# Patient Record
Sex: Female | Born: 1975
Health system: Southern US, Community
[De-identification: ages and names within clinical notes are randomized; demographics above are authoritative.]

## PROBLEM LIST (undated history)

## (undated) ENCOUNTER — Ambulatory Visit: Disposition: A | Discharge status: Home / Self Care | Payer: Self-pay

## (undated) DIAGNOSIS — Z9189 Other specified personal risk factors, not elsewhere classified: Secondary | ICD-10-CM

## (undated) DIAGNOSIS — Z1371 Encounter for nonprocreative screening for genetic disease carrier status: Secondary | ICD-10-CM

## (undated) DIAGNOSIS — D649 Anemia, unspecified: Secondary | ICD-10-CM

## (undated) DIAGNOSIS — F419 Anxiety disorder, unspecified: Secondary | ICD-10-CM

## (undated) DIAGNOSIS — Z803 Family history of malignant neoplasm of breast: Secondary | ICD-10-CM

## (undated) DIAGNOSIS — Z8041 Family history of malignant neoplasm of ovary: Secondary | ICD-10-CM

## (undated) DIAGNOSIS — Z9289 Personal history of other medical treatment: Secondary | ICD-10-CM

## (undated) DIAGNOSIS — Z1379 Encounter for other screening for genetic and chromosomal anomalies: Secondary | ICD-10-CM

## (undated) DIAGNOSIS — N83209 Unspecified ovarian cyst, unspecified side: Secondary | ICD-10-CM

## (undated) DIAGNOSIS — D573 Sickle-cell trait: Secondary | ICD-10-CM

## (undated) DIAGNOSIS — O30009 Twin pregnancy, unspecified number of placenta and unspecified number of amniotic sacs, unspecified trimester: Secondary | ICD-10-CM

## (undated) DIAGNOSIS — Z6790 Unspecified blood type, Rh positive: Secondary | ICD-10-CM

## (undated) HISTORY — DX: Twin pregnancy, unspecified number of placenta and unspecified number of amniotic sacs, unspecified trimester: O30.009

## (undated) HISTORY — DX: Anxiety disorder, unspecified: F41.9

## (undated) HISTORY — DX: Other specified personal risk factors, not elsewhere classified: Z91.89

## (undated) HISTORY — DX: Unspecified blood type, Rh positive: Z67.90

## (undated) HISTORY — DX: Unspecified ovarian cyst, unspecified side: N83.209

## (undated) HISTORY — DX: Family history of malignant neoplasm of ovary: Z80.41

## (undated) HISTORY — DX: Family history of malignant neoplasm of breast: Z80.3

## (undated) HISTORY — DX: Personal history of other medical treatment: Z92.89

## (undated) HISTORY — DX: Anemia, unspecified: D64.9

## (undated) HISTORY — DX: Encounter for nonprocreative screening for genetic disease carrier status: Z13.71

## (undated) HISTORY — DX: Encounter for other screening for genetic and chromosomal anomalies: Z13.79

## (undated) HISTORY — DX: Sickle-cell trait: D57.3

---

## 2004-05-29 HISTORY — PX: FOOT SURGERY: SHX648

## 2004-11-25 ENCOUNTER — Ambulatory Visit: Payer: Self-pay | Admitting: Podiatry

## 2005-06-16 ENCOUNTER — Ambulatory Visit: Payer: Self-pay | Admitting: Occupational Therapy

## 2007-01-10 ENCOUNTER — Encounter: Payer: Self-pay | Admitting: Maternal & Fetal Medicine

## 2007-03-21 ENCOUNTER — Encounter: Payer: Self-pay | Admitting: Maternal and Fetal Medicine

## 2007-05-15 ENCOUNTER — Inpatient Hospital Stay: Payer: Self-pay

## 2007-05-15 DIAGNOSIS — O30009 Twin pregnancy, unspecified number of placenta and unspecified number of amniotic sacs, unspecified trimester: Secondary | ICD-10-CM

## 2007-05-15 HISTORY — DX: Twin pregnancy, unspecified number of placenta and unspecified number of amniotic sacs, unspecified trimester: O30.009

## 2011-06-21 ENCOUNTER — Ambulatory Visit: Payer: Self-pay

## 2013-11-26 DIAGNOSIS — N83209 Unspecified ovarian cyst, unspecified side: Secondary | ICD-10-CM

## 2013-11-26 HISTORY — DX: Unspecified ovarian cyst, unspecified side: N83.209

## 2015-07-22 ENCOUNTER — Encounter: Payer: Self-pay | Admitting: Family Medicine

## 2015-08-17 ENCOUNTER — Ambulatory Visit (INDEPENDENT_AMBULATORY_CARE_PROVIDER_SITE_OTHER): Payer: BLUE CROSS/BLUE SHIELD | Admitting: Family Medicine

## 2015-08-17 ENCOUNTER — Encounter: Payer: Self-pay | Admitting: Family Medicine

## 2015-08-17 VITALS — BP 120/62 | HR 70 | Temp 98.7°F | Resp 14 | Ht 66.0 in | Wt 167.0 lb

## 2015-08-17 DIAGNOSIS — R599 Enlarged lymph nodes, unspecified: Secondary | ICD-10-CM | POA: Diagnosis not present

## 2015-08-17 DIAGNOSIS — E559 Vitamin D deficiency, unspecified: Secondary | ICD-10-CM | POA: Diagnosis not present

## 2015-08-17 DIAGNOSIS — Z0000 Encounter for general adult medical examination without abnormal findings: Secondary | ICD-10-CM

## 2015-08-17 DIAGNOSIS — R591 Generalized enlarged lymph nodes: Secondary | ICD-10-CM

## 2015-08-17 LAB — COMPREHENSIVE METABOLIC PANEL
ALK PHOS: 49 U/L (ref 33–115)
ALT: 8 U/L (ref 6–29)
AST: 13 U/L (ref 10–30)
Albumin: 3.8 g/dL (ref 3.6–5.1)
BUN: 7 mg/dL (ref 7–25)
CHLORIDE: 108 mmol/L (ref 98–110)
CO2: 23 mmol/L (ref 20–31)
Calcium: 8.9 mg/dL (ref 8.6–10.2)
Creat: 0.7 mg/dL (ref 0.50–1.10)
GLUCOSE: 80 mg/dL (ref 70–99)
POTASSIUM: 4 mmol/L (ref 3.5–5.3)
Sodium: 136 mmol/L (ref 135–146)
Total Bilirubin: 0.8 mg/dL (ref 0.2–1.2)
Total Protein: 7.1 g/dL (ref 6.1–8.1)

## 2015-08-17 LAB — CBC WITH DIFFERENTIAL/PLATELET
BASOS ABS: 0 10*3/uL (ref 0.0–0.1)
Basophils Relative: 1 % (ref 0–1)
EOS ABS: 0.1 10*3/uL (ref 0.0–0.7)
EOS PCT: 2 % (ref 0–5)
HCT: 36.3 % (ref 36.0–46.0)
Hemoglobin: 11.9 g/dL — ABNORMAL LOW (ref 12.0–15.0)
LYMPHS ABS: 1.5 10*3/uL (ref 0.7–4.0)
Lymphocytes Relative: 40 % (ref 12–46)
MCH: 26.7 pg (ref 26.0–34.0)
MCHC: 32.8 g/dL (ref 30.0–36.0)
MCV: 81.4 fL (ref 78.0–100.0)
MPV: 10.2 fL (ref 8.6–12.4)
Monocytes Absolute: 0.2 10*3/uL (ref 0.1–1.0)
Monocytes Relative: 6 % (ref 3–12)
Neutro Abs: 1.9 10*3/uL (ref 1.7–7.7)
Neutrophils Relative %: 51 % (ref 43–77)
PLATELETS: 285 10*3/uL (ref 150–400)
RBC: 4.46 MIL/uL (ref 3.87–5.11)
RDW: 15.3 % (ref 11.5–15.5)
WBC: 3.7 10*3/uL — AB (ref 4.0–10.5)

## 2015-08-17 LAB — TSH: TSH: 1.81 mIU/L

## 2015-08-17 NOTE — Patient Instructions (Addendum)
Release of records- Dr. Cala [facility] We will call with lab results I recommend eye visit once a year I recommend dental visit every 6 months Goal is to  Exercise 30 minutes 5 days a week [place] to be done of neck F/U pending results

## 2015-08-17 NOTE — Progress Notes (Signed)
Patient ID: Rebecca Sexton, female   DOB: 08-Nov-1975, 40 y.o.   MRN: [id number]    Subjective:    Patient ID: Rebecca Sexton, female    DOB: Nov 26, 1975, 40 y.o.   MRN: [id number]  Patient presents for CPE Patient here to establish care and for physical exam. She has a GYN in [facility] Dr. Edilia Bo at [place]. She is followed by [facility]. Her only medication is Tri-Sprintec for birth control.  She's been fairly healthy she tries to monitor her diet. She exercises some. Her family history medical history reviewed.  Her only concern is a small knot that is behind her right ear she stated been there for a couple years it seems to be growing in size. It does not cause any pain. She's never had any drainage from the lesion. She states doctors in the past and told her not to worry about it. She also gets a strange skin sensation where she feels an itching sensation on her upper arms she's never had any rashes is been going on for over a year. She tried different moisturizers tried different detergents. It does not occur all the time.  She has 3 children, 94 year old twins, 73 year old    Review Of Systems:  GEN- denies fatigue, fever, weight loss,weakness, recent illness HEENT- denies eye drainage, change in vision, nasal discharge, CVS- denies chest pain, palpitations RESP- denies SOB, cough, wheeze ABD- denies N/V, change in stools, abd pain GU- denies dysuria, hematuria, dribbling, incontinence MSK- denies joint pain, muscle aches, injury Neuro- denies headache, dizziness, syncope, seizure activity       Objective:    BP 120/62 mmHg  Pulse 70  Temp(Src) 98.7 F (37.1 C) (Oral)  Resp 14  Ht 5\' 6"  (1.676 m)  Wt 167 lb (75.751 kg)  BMI 26.97 kg/m2  LMP 07/27/2015 (Approximate) GEN- NAD, alert and oriented x3 HEENT- PERRL, EOMI, non injected sclera, pink conjunctiva, MMM, oropharynx clear Neck- Supple, no thyromegaly , Right post auricular small pea size  nodule-slightly mobile CVS- RRR, no murmur RESP-CTAB ABD-NABS,soft,NT,ND Ski- intact no rash Psych- normal affect and mood  EXT- No edema Pulses- Radial, DP- 2+        Assessment & Plan:      Problem List Items Addressed This Visit    None    Visit Diagnoses    Routine general medical examination at a health care facility    -  Primary    obtain records from GYN- she had cholesterol done in fall, immunizations UTD, mammo and PAP UTD had in 2016    Relevant Orders    CBC with Differential/Platelet (Completed)    Comprehensive metabolic panel (Completed)    TSH (Completed)    Vitamin D deficiency        currently on Vitamin D 2000IU daily    Relevant Orders    Vitamin D, 25-hydroxy (Completed)    Lymphadenopathy of head and neck        Possible lymph node vs Cyst, obtain US to evaluate due to change in size. no recent illness    Relevant Orders    US Soft Tissue Head/Neck       Note: This dictation was prepared with Dragon dictation along with smaller phrase technology. Any transcriptional errors that result from this process are unintentional.

## 2015-08-18 ENCOUNTER — Encounter: Payer: Self-pay | Admitting: Family Medicine

## 2015-08-18 ENCOUNTER — Other Ambulatory Visit: Payer: Self-pay | Admitting: *Deleted

## 2015-08-18 LAB — VITAMIN D 25 HYDROXY (VIT D DEFICIENCY, FRACTURES): Vit D, 25-Hydroxy: 22 ng/mL — ABNORMAL LOW (ref 30–100)

## 2015-08-18 MED ORDER — VITAMIN D (ERGOCALCIFEROL) 1.25 MG (50000 UNIT) PO CAPS: 50000.0000 [IU] | ORAL_CAPSULE | ORAL | Status: DC

## 2015-08-23 ENCOUNTER — Ambulatory Visit
Admission: RE | Admit: 2015-08-23 | Discharge: 2015-08-23 | Disposition: A | Discharge status: Home / Self Care | Payer: BLUE CROSS/BLUE SHIELD | Source: Ambulatory Visit | Attending: Family Medicine | Admitting: Family Medicine

## 2015-08-23 DIAGNOSIS — R591 Generalized enlarged lymph nodes: Secondary | ICD-10-CM

## 2015-10-26 ENCOUNTER — Ambulatory Visit (INDEPENDENT_AMBULATORY_CARE_PROVIDER_SITE_OTHER): Payer: BLUE CROSS/BLUE SHIELD | Admitting: Family Medicine

## 2015-10-26 ENCOUNTER — Encounter: Payer: Self-pay | Admitting: Family Medicine

## 2015-10-26 VITALS — BP 118/68 | HR 70 | Temp 98.5°F | Resp 14 | Ht 66.0 in | Wt 163.0 lb

## 2015-10-26 DIAGNOSIS — F418 Other specified anxiety disorders: Secondary | ICD-10-CM

## 2015-10-26 MED ORDER — PROPRANOLOL HCL 10 MG PO TABS: ORAL_TABLET | ORAL | Status: DC

## 2015-10-26 MED ORDER — SCOPOLAMINE 1 MG/3DAYS TD PT72: 1.0000 | MEDICATED_PATCH | TRANSDERMAL | Status: DC

## 2015-10-26 NOTE — Patient Instructions (Signed)
Try the propranolol as prescribed  F/U as needed

## 2015-10-27 ENCOUNTER — Ambulatory Visit: Payer: BLUE CROSS/BLUE SHIELD | Admitting: Family Medicine

## 2015-10-27 NOTE — Progress Notes (Signed)
Patient ID: Marizol Ramirez, female   DOB: [birth year], 40 y.o.   MRN: [id number]   Subjective:    Patient ID: [name], female    DOB: 1975-12-16, 40 y.o.   MRN: [id number]  Patient presents for Anxiety Patient with anxiety with public presentations. She's been doing the same job for the past 7 years but has noticed that she has had increasing anxiety with presenting of the past few months. Nothing particular change with her workload she feels fine at home there are no stressors in her family life. She states she's always had a little anxiety with public speaking but never this severe. Her last presentation she actually had a full-blown panic attack she was hyperventilating had chest pain palpitations and her partner had to take over she had to step outside and it took her a few hours to calm herself back. Now she's, anticipating this will happen again and she has a few more presentations coming up in the next couple weeks. She is sleeping well nothing was at a routine prior to her last presentation when she had severe panic attack.    Review Of Systems:  GEN- denies fatigue, fever, weight loss,weakness, recent illness HEENT- denies eye drainage, change in vision, nasal discharge, CVS- denies chest pain, palpitations RESP- denies SOB, cough, wheeze ABD- denies N/V, change in stools, abd pain GU- denies dysuria, hematuria, dribbling, incontinence MSK- denies joint pain, muscle aches, injury Neuro- denies headache, dizziness, syncope, seizure activity       Objective:    BP 118/68 mmHg  Pulse 70  Temp(Src) 98.5 F (36.9 C) (Oral)  Resp 14  Ht 5\' 6"  (1.676 m)  Wt 163 lb (73.936 kg)  BMI 26.32 kg/m2  LMP 10/21/2015 (Approximate) GEN- NAD, alert and oriented x3 HEENT- PERRL, EOMI, non injected sclera, pink conjunctiva, MMM, oropharynx clear Neck- Supple, no thyromegaly CVS- RRR, no murmur RESP-CTAB Psych- normal affect and mood        Assessment & Plan:      Problem  List Items Addressed This Visit    None    Visit Diagnoses    Situational anxiety    -  Primary     More public speaking, no sign of mood disorder otherwise, will try propranolol 10mg  1 hour before presentations       Note: This dictation was prepared with Dragon dictation along with smaller phrase technology. Any transcriptional errors that result from this process are unintentional.

## 2016-03-13 ENCOUNTER — Ambulatory Visit (INDEPENDENT_AMBULATORY_CARE_PROVIDER_SITE_OTHER): Payer: BLUE CROSS/BLUE SHIELD | Admitting: Family Medicine

## 2016-03-13 VITALS — BP 122/72 | HR 82 | Temp 98.8°F | Resp 17 | Ht 65.5 in | Wt 170.0 lb

## 2016-03-13 DIAGNOSIS — R300 Dysuria: Secondary | ICD-10-CM

## 2016-03-13 DIAGNOSIS — J069 Acute upper respiratory infection, unspecified: Secondary | ICD-10-CM

## 2016-03-13 LAB — POCT URINALYSIS DIP (MANUAL ENTRY)
Bilirubin, UA: NEGATIVE
Glucose, UA: NEGATIVE
Ketones, POC UA: NEGATIVE
NITRITE UA: NEGATIVE
PROTEIN UA: NEGATIVE
SPEC GRAV UA: 1.015
UROBILINOGEN UA: 0.2
pH, UA: 7

## 2016-03-13 LAB — POC MICROSCOPIC URINALYSIS (UMFC): MUCUS RE: ABSENT

## 2016-03-13 MED ORDER — NITROFURANTOIN MONOHYD MACRO 100 MG PO CAPS: 100.0000 mg | ORAL_CAPSULE | Freq: Two times a day (BID) | ORAL | 0 refills | Status: DC

## 2016-03-13 NOTE — Patient Instructions (Addendum)
IF you received an x-ray today, you will receive an invoice from [facility]. Please contact [facility] at [phone] with questions or concerns regarding your invoice.   IF you received labwork today, you will receive an invoice from [employer]. Please contact [facility] at [phone] with questions or concerns regarding your invoice.   Our billing staff will not be able to assist you with questions regarding bills from these companies.  You will be contacted with the lab results as soon as they are available. The fastest way to get your results is to activate your My Chart account. Instructions are located on the last page of this paperwork. If you have not heard from [place] regarding the results in 2 weeks, please contact this office.     Dysuria Dysuria is pain or discomfort while urinating. The pain or discomfort may be felt in the tube that carries urine out of the bladder (urethra) or in the surrounding tissue of the genitals. The pain may also be felt in the groin area, lower abdomen, and lower back. You may have to urinate frequently or have the sudden feeling that you have to urinate (urgency). Dysuria can affect both men and women, but is more common in women. Dysuria can be caused by many different things, including:  Urinary tract infection in women.  Infection of the kidney or bladder.  Kidney stones or bladder stones.  Certain sexually transmitted infections (STIs), such as chlamydia.  Dehydration.  Inflammation of the vagina.  Use of certain medicines.  Use of certain soaps or scented products that cause irritation. HOME CARE INSTRUCTIONS Watch your dysuria for any changes. The following actions may help to reduce any discomfort you are feeling:  Drink enough fluid to keep your urine clear or pale yellow.  Empty your bladder often. Avoid holding urine for long periods of time.  After a bowel movement or urination,  women should cleanse from front to back, using each tissue only once.  Empty your bladder after sexual intercourse.  Take medicines only as directed by your health care provider.  If you were prescribed an antibiotic medicine, finish it all even if you start to feel better.  Avoid caffeine, tea, and alcohol. They can irritate the bladder and make dysuria worse. In men, alcohol may irritate the prostate.  Keep all follow-up visits as directed by your health care provider. This is important.  If you had any tests done to find the cause of dysuria, it is your responsibility to obtain your test results. Ask the lab or department performing the test when and how you will get your results. Talk with your health care provider if you have any questions about your results. SEEK MEDICAL CARE IF:  You develop pain in your back or sides.  You have a fever.  You have nausea or vomiting.  You have blood in your urine.  You are not urinating as often as you usually do. SEEK IMMEDIATE MEDICAL CARE IF:  You pain is severe and not relieved with medicines.  You are unable to hold down any fluids.  You or someone else notices a change in your mental function.  You have a rapid heartbeat at rest.  You have shaking or chills.  You feel extremely weak.   This information is not intended to replace advice given to you by your health care provider. Make sure you discuss any questions you have with your health care provider.   Document Released:  02/11/2004 Document Revised: 06/05/2014 Document Reviewed: 01/08/2014 Elsevier Interactive Patient Education [employer].

## 2016-03-13 NOTE — Progress Notes (Addendum)
  Chief Complaint  Patient presents with  . Sinusitis  . Dysuria    HPI  Pt has been having some pressure with urination and urinary urgency She is currently on her period. No vaginal discharge She reports that she has a history of pyelonephritis No fevers or chills No back pain or flank pain   She reports that she has been having sinus pressure unrelieved by ibuprofen She reports sneezing No coughing, wheezing,  No history of asthma Nonsmoker   No past medical history on file.  Current Outpatient Prescriptions  Medication Sig Dispense Refill  . Cholecalciferol (VITAMIN D) 2000 units CAPS Take by mouth. Reported on 10/26/2015    . Norgestimate-Ethinyl Estradiol Triphasic (TRI-SPRINTEC) 0.18/0.215/0.25 MG-35 MCG tablet Take 1 tablet by mouth daily.    . propranolol (INDERAL) 10 MG tablet Take 1 hour before presentation, can repeat if needed 6 hours later 20 tablet 1  . scopolamine (TRANSDERM-SCOP) 1 MG/3DAYS Place 1 patch (1.5 mg total) onto the skin every 3 (three) days. 4 patch 12  . nitrofurantoin, macrocrystal-monohydrate, (MACROBID) 100 MG capsule Take 1 capsule (100 mg total) by mouth 2 (two) times daily. 14 capsule 0   No current facility-administered medications for this visit.     Allergies: No Known Allergies  Past Surgical History:  Procedure Laterality Date  . CESAREAN SECTION      Social History   Social History  . Marital status: Single    Spouse name: N/A  . Number of children: N/A  . Years of education: N/A   Social History Main Topics  . Smoking status: Never Smoker  . Smokeless tobacco: Never Used  . Alcohol use No  . Drug use: No  . Sexual activity: Yes   Other Topics Concern  . None   Social History Narrative  . None    ROS  Objective: Vitals:   03/13/16 1200  BP: 122/72  Pulse: 82  Resp: 17  Temp: 98.8 F (37.1 C)  TempSrc: Oral  SpO2: 100%  Weight: 170 lb (77.1 kg)  Height: 5' 5.5" (1.664 m)    Physical Exam General:  alert, oriented, in NAD Head: normocephalic, atraumatic, no sinus tenderness Eyes: EOM intact, no scleral icterus or conjunctival injection Ears: TM clear bilaterally Throat: no pharyngeal exudate or erythema Lymph: no posterior auricular, submental or cervical lymph adenopathy Heart: normal rate, normal sinus rhythm, no murmurs Lungs: clear to auscultation bilaterally, no wheezing   Assessment and Plan Victory was seen today for sinusitis and dysuria.  Diagnoses and all orders for this visit:  Dysuria- new problem, requiring further work up previous history of pyelonephritis will treat empirically with macrobid Will send culture -     POCT urinalysis dipstick -     POCT Microscopic Urinalysis ([facility]) -     nitrofurantoin, macrocrystal-monohydrate, (MACROBID) 100 MG capsule; Take 1 capsule (100 mg total) by mouth 2 (two) times daily. -     Urine culture  Acute upper respiratory infection- new problem  advised pt supportive care      [place]

## 2016-03-15 LAB — URINE CULTURE

## 2016-03-22 LAB — HM PAP SMEAR

## 2016-03-29 DIAGNOSIS — Z9189 Other specified personal risk factors, not elsewhere classified: Secondary | ICD-10-CM

## 2016-03-29 DIAGNOSIS — Z1371 Encounter for nonprocreative screening for genetic disease carrier status: Secondary | ICD-10-CM

## 2016-03-29 DIAGNOSIS — Z803 Family history of malignant neoplasm of breast: Secondary | ICD-10-CM

## 2016-03-29 DIAGNOSIS — Z1379 Encounter for other screening for genetic and chromosomal anomalies: Secondary | ICD-10-CM

## 2016-03-29 HISTORY — DX: Other specified personal risk factors, not elsewhere classified: Z91.89

## 2016-03-29 HISTORY — DX: Encounter for nonprocreative screening for genetic disease carrier status: Z13.71

## 2016-03-29 HISTORY — DX: Encounter for other screening for genetic and chromosomal anomalies: Z13.79

## 2016-03-29 HISTORY — DX: Family history of malignant neoplasm of breast: Z80.3

## 2016-04-10 ENCOUNTER — Other Ambulatory Visit: Payer: Self-pay | Admitting: Obstetrics and Gynecology

## 2016-04-10 DIAGNOSIS — Z1231 Encounter for screening mammogram for malignant neoplasm of breast: Secondary | ICD-10-CM

## 2016-05-16 ENCOUNTER — Ambulatory Visit
Admission: RE | Admit: 2016-05-16 | Discharge: 2016-05-16 | Disposition: A | Discharge status: Home / Self Care | Payer: BLUE CROSS/BLUE SHIELD | Source: Ambulatory Visit | Attending: Obstetrics and Gynecology | Admitting: Obstetrics and Gynecology

## 2016-05-16 DIAGNOSIS — Z1231 Encounter for screening mammogram for malignant neoplasm of breast: Secondary | ICD-10-CM | POA: Insufficient documentation

## 2016-05-16 LAB — HM MAMMOGRAPHY

## 2016-10-19 ENCOUNTER — Other Ambulatory Visit: Payer: Self-pay | Admitting: Family Medicine

## 2017-01-08 ENCOUNTER — Ambulatory Visit: Payer: BLUE CROSS/BLUE SHIELD | Admitting: Obstetrics and Gynecology

## 2017-01-12 ENCOUNTER — Encounter: Payer: Self-pay | Admitting: Family Medicine

## 2017-01-12 ENCOUNTER — Ambulatory Visit (INDEPENDENT_AMBULATORY_CARE_PROVIDER_SITE_OTHER): Payer: BLUE CROSS/BLUE SHIELD | Admitting: Family Medicine

## 2017-01-12 ENCOUNTER — Other Ambulatory Visit: Payer: Self-pay | Admitting: Family Medicine

## 2017-01-12 VITALS — BP 112/78 | HR 76 | Temp 98.8°F | Resp 15 | Ht 66.0 in | Wt 174.6 lb

## 2017-01-12 DIAGNOSIS — F40248 Other situational type phobia: Secondary | ICD-10-CM | POA: Diagnosis not present

## 2017-01-12 DIAGNOSIS — E559 Vitamin D deficiency, unspecified: Secondary | ICD-10-CM | POA: Diagnosis not present

## 2017-01-12 DIAGNOSIS — Z0000 Encounter for general adult medical examination without abnormal findings: Secondary | ICD-10-CM

## 2017-01-12 DIAGNOSIS — Z13220 Encounter for screening for lipoid disorders: Secondary | ICD-10-CM | POA: Diagnosis not present

## 2017-01-12 LAB — CBC WITH DIFFERENTIAL/PLATELET
BASOS ABS: 45 {cells}/uL (ref 0–200)
Basophils Relative: 1 %
EOS PCT: 2 %
Eosinophils Absolute: 90 cells/uL (ref 15–500)
HCT: 33.6 % — ABNORMAL LOW (ref 35.0–45.0)
Hemoglobin: 11 g/dL — ABNORMAL LOW (ref 12.0–15.0)
LYMPHS ABS: 1305 {cells}/uL (ref 850–3900)
Lymphocytes Relative: 29 %
MCH: 27.2 pg (ref 27.0–33.0)
MCHC: 32.7 g/dL (ref 32.0–36.0)
MCV: 83 fL (ref 80.0–100.0)
MONOS PCT: 5 %
MPV: 9.7 fL (ref 7.5–12.5)
Monocytes Absolute: 225 cells/uL (ref 200–950)
NEUTROS ABS: 2835 {cells}/uL (ref 1500–7800)
Neutrophils Relative %: 63 %
PLATELETS: 284 10*3/uL (ref 140–400)
RBC: 4.05 MIL/uL (ref 3.80–5.10)
RDW: 15.2 % — AB (ref 11.0–15.0)
WBC: 4.5 10*3/uL (ref 3.8–10.8)

## 2017-01-12 MED ORDER — PROPRANOLOL HCL 10 MG PO TABS: ORAL_TABLET | ORAL | 2 refills | Status: DC

## 2017-01-12 NOTE — Patient Instructions (Signed)
I recommend eye visit once a year I recommend dental visit every 6 months Goal is to  Exercise 30 minutes 5 days a week We will send a letter with lab results  F/u 1 YEAR for Physical

## 2017-01-12 NOTE — Progress Notes (Signed)
   Subjective:    Patient ID: Rebecca Sexton, female    DOB: November 13, 1975, 41 y.o.   MRN: [id number]  Patient presents for Annual Exam   Speaker anxiety - gets nervous before presentations/speeches, she uses propranolol as needed     Recently restarted birth control a month ago, she had stopped after husband had a vasectomy, Cycle got off so she restarted . Cycles now regular    History vitamin D deficiency    Weight up 7llbs in the past year, has not been exercising,     Has GYN appt in October / Mamogram UTD Dec, had BRACA testing with was negative     No concerns     Review Of Systems:  GEN- denies fatigue, fever, weight loss,weakness, recent illness HEENT- denies eye drainage, change in vision, nasal discharge, CVS- denies chest pain, palpitations RESP- denies SOB, cough, wheeze ABD- denies N/V, change in stools, abd pain GU- denies dysuria, hematuria, dribbling, incontinence MSK- denies joint pain, muscle aches, injury Neuro- denies headache, dizziness, syncope, seizure activity       Objective:    BP 112/78   Pulse 76   Temp 98.8 F (37.1 C) (Oral)   Resp 15   Ht 5\' 6"  (1.676 m)   Wt 174 lb 9.6 oz (79.2 kg)   SpO2 99%   BMI 28.18 kg/m  GEN- NAD, alert and oriented x3 HEENT- PERRL, EOMI, non injected sclera, pink conjunctiva, MMM, oropharynx clear Neck- Supple, no thyromegaly CVS- RRR, no murmur RESP-CTAB ABD-NABS,soft,NT,ND EXT- No edema Pulses- Radial, DP- 2+        Assessment & Plan:      Problem List Items Addressed This Visit    None    Visit Diagnoses    Routine general medical examination at a health care facility    -  Primary   CPE done, immunizations UTD, defer to GYN for mammoPAP Smear, fasting labs today, continue propranolol for public speaking anxiety   Relevant Orders   CBC with Differential/Platelet   Comprehensive metabolic panel   Lipid panel   Screening cholesterol level       Vitamin D deficiency       Relevant Orders   Vitamin D, [id number]   Fear of public speaking          Note: This dictation was prepared with Dragon dictation along with smaller phrase technology. Any transcriptional errors that result from this process are unintentional.

## 2017-01-13 LAB — COMPREHENSIVE METABOLIC PANEL
ALT: 11 U/L (ref 6–29)
AST: 12 U/L (ref 10–30)
Albumin: 3.6 g/dL (ref 3.6–5.1)
Alkaline Phosphatase: 49 U/L (ref 33–115)
BUN: 9 mg/dL (ref 7–25)
CO2: 22 mmol/L (ref 20–32)
CREATININE: 0.77 mg/dL (ref 0.50–1.10)
Calcium: 8.7 mg/dL (ref 8.6–10.2)
Chloride: 106 mmol/L (ref 98–110)
GLUCOSE: 76 mg/dL (ref 70–99)
Potassium: 4.1 mmol/L (ref 3.5–5.3)
SODIUM: 137 mmol/L (ref 135–146)
TOTAL PROTEIN: 6.5 g/dL (ref 6.1–8.1)
Total Bilirubin: 0.8 mg/dL (ref 0.2–1.2)

## 2017-01-13 LAB — LIPID PANEL
Cholesterol: 154 mg/dL (ref <200)
HDL: 58 mg/dL (ref >50)
LDL CALC: 71 mg/dL (ref <100)
Total CHOL/HDL Ratio: 2.7 Ratio (ref <5.0)
Triglycerides: 123 mg/dL (ref <150)
VLDL: 25 mg/dL (ref <30)

## 2017-01-13 LAB — VITAMIN D 25 HYDROXY (VIT D DEFICIENCY, FRACTURES): Vit D, 25-Hydroxy: 21 ng/mL — ABNORMAL LOW (ref 30–100)

## 2017-01-16 LAB — ANEMIA PANEL 7
%SAT: 15 % (ref 11–50)
FERRITIN: 10 ng/mL (ref 10–232)
FOLATE: 8.5 ng/mL (ref >5.4)
IRON: 50 ug/dL (ref 40–190)
TIBC: 335 ug/dL (ref 250–450)
UIBC: 285 ug/dL
VITAMIN B 12: 327 pg/mL (ref 200–1100)

## 2017-07-02 ENCOUNTER — Other Ambulatory Visit: Payer: Self-pay | Admitting: Family Medicine

## 2017-07-02 DIAGNOSIS — Z1231 Encounter for screening mammogram for malignant neoplasm of breast: Secondary | ICD-10-CM

## 2017-07-12 ENCOUNTER — Ambulatory Visit
Admission: RE | Admit: 2017-07-12 | Discharge: 2017-07-12 | Disposition: A | Discharge status: Home / Self Care | Payer: BLUE CROSS/BLUE SHIELD | Source: Ambulatory Visit | Attending: Family Medicine | Admitting: Family Medicine

## 2017-07-12 ENCOUNTER — Ambulatory Visit (INDEPENDENT_AMBULATORY_CARE_PROVIDER_SITE_OTHER): Payer: BLUE CROSS/BLUE SHIELD | Admitting: Obstetrics and Gynecology

## 2017-07-12 ENCOUNTER — Encounter: Payer: Self-pay | Admitting: Obstetrics and Gynecology

## 2017-07-12 VITALS — BP 120/80 | HR 72 | Ht 66.0 in | Wt 174.0 lb

## 2017-07-12 DIAGNOSIS — Z1231 Encounter for screening mammogram for malignant neoplasm of breast: Secondary | ICD-10-CM | POA: Insufficient documentation

## 2017-07-12 DIAGNOSIS — Z803 Family history of malignant neoplasm of breast: Secondary | ICD-10-CM

## 2017-07-12 DIAGNOSIS — Z9189 Other specified personal risk factors, not elsewhere classified: Secondary | ICD-10-CM | POA: Diagnosis not present

## 2017-07-12 DIAGNOSIS — Z1239 Encounter for other screening for malignant neoplasm of breast: Secondary | ICD-10-CM

## 2017-07-12 DIAGNOSIS — Z01419 Encounter for gynecological examination (general) (routine) without abnormal findings: Secondary | ICD-10-CM

## 2017-07-12 DIAGNOSIS — Z3041 Encounter for surveillance of contraceptive pills: Secondary | ICD-10-CM | POA: Diagnosis not present

## 2017-07-12 MED ORDER — NORGESTIM-ETH ESTRAD TRIPHASIC 0.18/0.215/0.25 MG-35 MCG PO TABS: 1.0000 | ORAL_TABLET | Freq: Every day | ORAL | 3 refills | Status: DC

## 2017-07-12 NOTE — Patient Instructions (Signed)
I value your feedback and entrusting us with your care. If you get a [facility] patient survey, I would appreciate you taking the time to let us know about your experience today. Thank you! 

## 2017-07-12 NOTE — Progress Notes (Signed)
PCP:  [name], MD   Chief Complaint  Patient presents with  . Gynecologic Exam     HPI:      [name] is a 42 y.o. G2P2003 who LMP was Patient's last menstrual period was 07/07/2017., presents today for her annual examination.  Her menses are regular every 28-30 days, lasting 5 days.  Dysmenorrhea mild, occurring first 1-2 days of flow. She does not have intermenstrual bleeding. She tried going off OCPs but had BTB. Sx resolved once restarting them. Doens't think she had irreg menses off OCPs years ago.  Sex activity: single partner, contraception - OCP (estrogen/progesterone) and vasectomy.  Last Pap: March 22, 2016  Results were: no abnormalities /neg HPV DNA  Hx of STDs: none  Last mammogram: today; Results were: pending. There is a FH of breast cancer in her mat aunt and 2 pat aunts. There is a FH of ovarian cancer in a 3rd pat aunt. Pt is MyRisk neg except ATM VUS. The patient does do self-breast exams. IBIS=24%. Taking Vit D supp. Has not had scr breast MRI as discussed previously.  Tobacco use: The patient denies current or previous tobacco use. Alcohol use: none No drug use.  Exercise: moderately active  She does get adequate calcium and Vitamin D in her diet.  Past Medical History:  Diagnosis Date  . Blood type, Rh positive   . BRCA negative 03/2016   MyRisk neg except ATM VUS  . Family history of breast cancer 03/2016   MyRisk neg  . Family history of ovarian cancer    PAT AUNT  . Genetic testing of female 03/2016   ATM VUS  . History of mammogram 06/21/2011; [id number]   BIRADS 2 BENIGN @ [facility];  NEG  . History of Papanicolaou smear of cervix 03/06/2013; 03/22/2016   -/-; -/-  . Increased risk of breast cancer 03/2016   IBIS=24.4% PER [facility]  . Ovarian cyst 11/2013   RIGHT OV CYSTS  . Sickle cell trait ([place])   . Twin pregnancy delivered 05/15/2007   FEMALE/FEMALE @ 37WKS    Past Surgical History:  Procedure Laterality Date  .  CESAREAN SECTION  05/15/2007   TWINS  . FOOT SURGERY  2006   LEFT    Family History  Problem Relation Age of Onset  . Hypertension Mother   . Aneurysm Mother   . Hyperlipidemia Mother   . Cancer Father 70       Lung/Cancer   . Stroke Father   . Heart disease Father        Heart attack at death   . Hypertension Sister   . Other Sister        TWIN PREG  . Breast cancer Maternal Aunt 60  . Diabetes Maternal Uncle   . Breast cancer Paternal Aunt 65  . Breast cancer Paternal Aunt 52  . Other Cousin        TWIN PREG  . Ovarian cancer Paternal Aunt 6    Social History   Socioeconomic History  . Marital status: Married    Spouse name: Not on file  . Number of children: 3  . Years of education: 73  . Highest education level: Not on file  Social Needs  . Financial resource strain: Not on file  . Food insecurity - worry: Not on file  . Food insecurity - inability: Not on file  . Transportation needs - medical: Not on file  . Transportation needs - non-medical: Not on  file  Occupational History  . Occupation: INSURANCE AGENT  Tobacco Use  . Smoking status: Never Smoker  . Smokeless tobacco: Never Used  Substance and Sexual Activity  . Alcohol use: No  . Drug use: No  . Sexual activity: Yes    Birth control/protection: Pill  Other Topics Concern  . Not on file  Social History Narrative  . Not on file    No outpatient medications have been marked as taking for the 07/12/17 encounter (Office Visit) with Bowen Goyal, Deirdre Evener, PA-C.     ROS:  Review of Systems  Constitutional: Negative for fatigue, fever and unexpected weight change.  Respiratory: Negative for cough, shortness of breath and wheezing.   Cardiovascular: Negative for chest pain, palpitations and leg swelling.  Gastrointestinal: Negative for blood in stool, constipation, diarrhea, nausea and vomiting.  Endocrine: Negative for cold intolerance, heat intolerance and polyuria.  Genitourinary: Negative for  dyspareunia, dysuria, flank pain, frequency, genital sores, hematuria, menstrual problem, pelvic pain, urgency, vaginal bleeding, vaginal discharge and vaginal pain.  Musculoskeletal: Negative for back pain, joint swelling and myalgias.  Skin: Negative for rash.  Neurological: Negative for dizziness, syncope, light-headedness, numbness and headaches.  Hematological: Negative for adenopathy.  Psychiatric/Behavioral: Negative for agitation, confusion, sleep disturbance and suicidal ideas. The patient is not nervous/anxious.      Objective: BP 120/80   Pulse 72   Ht 5' 6"  (1.676 m)   Wt 174 lb (78.9 kg)   LMP 07/07/2017   BMI 28.08 kg/m    Physical Exam  Constitutional: She is oriented to person, place, and time. She appears well-developed and well-nourished.  Genitourinary: Vagina normal and uterus normal. There is no rash or tenderness on the right labia. There is no rash or tenderness on the left labia. No erythema or tenderness in the vagina. No vaginal discharge found. Right adnexum does not display mass and does not display tenderness. Left adnexum does not display mass and does not display tenderness. Cervix does not exhibit motion tenderness or polyp. Uterus is not enlarged or tender.  Neck: Normal range of motion. No thyromegaly present.  Cardiovascular: Normal rate, regular rhythm and normal heart sounds.  No murmur heard. Pulmonary/Chest: Effort normal and breath sounds normal. Right breast exhibits no mass, no nipple discharge, no skin change and no tenderness. Left breast exhibits no mass, no nipple discharge, no skin change and no tenderness.  Abdominal: Soft. There is no tenderness. There is no guarding.  Musculoskeletal: Normal range of motion.  Neurological: She is alert and oriented to person, place, and time. No cranial nerve deficit.  Psychiatric: She has a normal mood and affect. Her behavior is normal.  Vitals reviewed.  Assessment/Plan: Encounter for annual  routine gynecological examination  Screening for breast cancer - Pt has mammo today.  Encounter for surveillance of contraceptive pills - OCP RF. - Plan: Norgestimate-Ethinyl Estradiol Triphasic (TRI-SPRINTEC) 0.18/0.215/0.25 MG-35 MCG tablet  Family history of breast cancer  Increased risk of breast cancer - IBIS=24%. Cont monthly SBE, yearly CBE and mammos. Offered scr breast MRI yearly. Pt to f/u for ref if desires. Cont Vit D.  Meds ordered this encounter  Medications  . Norgestimate-Ethinyl Estradiol Triphasic (TRI-SPRINTEC) 0.18/0.215/0.25 MG-35 MCG tablet    Sig: Take 1 tablet by mouth daily.    Dispense:  3 Package    Refill:  3    Order Specific Question:   Supervising Provider    Answer:   Gae Dry [phone]  GYN counsel breast self exam, mammography screening, adequate intake of calcium and vitamin D, diet and exercise     F/U  Return in about 1 year (around 07/12/2018).  Laren Whaling B. Adela Esteban, PA-C 07/12/2017 10:34 AM

## 2018-01-15 ENCOUNTER — Encounter: Payer: Self-pay | Admitting: Family Medicine

## 2018-01-15 ENCOUNTER — Other Ambulatory Visit: Payer: Self-pay

## 2018-01-15 ENCOUNTER — Ambulatory Visit (INDEPENDENT_AMBULATORY_CARE_PROVIDER_SITE_OTHER): Payer: BLUE CROSS/BLUE SHIELD | Admitting: Family Medicine

## 2018-01-15 VITALS — BP 122/74 | HR 71 | Temp 98.4°F | Resp 18 | Ht 64.5 in | Wt 174.0 lb

## 2018-01-15 DIAGNOSIS — Z0000 Encounter for general adult medical examination without abnormal findings: Secondary | ICD-10-CM

## 2018-01-15 DIAGNOSIS — Z23 Encounter for immunization: Secondary | ICD-10-CM

## 2018-01-15 DIAGNOSIS — E663 Overweight: Secondary | ICD-10-CM | POA: Diagnosis not present

## 2018-01-15 DIAGNOSIS — E559 Vitamin D deficiency, unspecified: Secondary | ICD-10-CM | POA: Diagnosis not present

## 2018-01-15 DIAGNOSIS — F40248 Other situational type phobia: Secondary | ICD-10-CM

## 2018-01-15 DIAGNOSIS — F4010 Social phobia, unspecified: Secondary | ICD-10-CM | POA: Insufficient documentation

## 2018-01-15 MED ORDER — PROPRANOLOL HCL 10 MG PO TABS: ORAL_TABLET | ORAL | 2 refills | Status: DC

## 2018-01-15 NOTE — Progress Notes (Signed)
Patient: Rebecca Sexton, Female    DOB: 27-Feb-1976, 42 y.o.   MRN: [id number] Visit Date: 01/15/2018  Today's Provider: Delsa Grana, PA-C   Chief Complaint  Patient presents with  . Annual Exam   Subjective:    Annual physical exam Rebecca Sexton is a 42 y.o. female who presents today for health maintenance and complete physical. She feels well. She reports exercising - not at all right now, wants to start. She reports she is sleeping well.  -----------------------------------------------------------------  Hx of Vit D deficiency - has not been taking supplements  Anxiety with public speaking - uses propranolol about 10 times a months w/o side effects, needs refill  No other concerns, no complaints, does wants to loose weight, and wants to exercise more  PAP and mammogram UTD with [facility] - mammogram result in chart - PAP not in system, request record  Review of Systems  Constitutional: Negative.   HENT: Negative.   Eyes: Negative.   Respiratory: Negative.   Cardiovascular: Negative.   Gastrointestinal: Negative.   Endocrine: Negative.   Genitourinary: Negative.   Musculoskeletal: Negative.   Skin: Negative.   Allergic/Immunologic: Negative.   Neurological: Negative.   Hematological: Negative.   Psychiatric/Behavioral: Negative.   All other systems reviewed and are negative.   Social History      She  reports that she has never smoked. She has never used smokeless tobacco. She reports that she does not drink alcohol or use drugs.       Social History   Socioeconomic History  . Marital status: Married    Spouse name: Not on file  . Number of children: 3  . Years of education: 25  . Highest education level: Not on file  Occupational History  . Occupation: [employer]  . Financial resource strain: Not on file  . Food insecurity:    Worry: Not on file    Inability: Not on file  . Transportation needs:    Medical: Not on file   Non-medical: Not on file  Tobacco Use  . Smoking status: Never Smoker  . Smokeless tobacco: Never Used  Substance and Sexual Activity  . Alcohol use: No  . Drug use: No  . Sexual activity: Yes    Birth control/protection: Pill  Lifestyle  . Physical activity:    Days per week: Not on file    Minutes per session: Not on file  . Stress: Not on file  Relationships  . Social connections:    Talks on phone: Not on file    Gets together: Not on file    Attends religious service: Not on file    Active member of club or organization: Not on file    Attends meetings of clubs or organizations: Not on file    Relationship status: Not on file  Other Topics Concern  . Not on file  Social History Narrative  . Not on file    Past Medical History:  Diagnosis Date  . Blood type, Rh positive   . BRCA negative 03/2016   MyRisk neg except ATM VUS  . Family history of breast cancer 03/2016   MyRisk neg  . Family history of ovarian cancer    PAT AUNT  . Genetic testing of female 03/2016   ATM VUS  . History of mammogram 06/21/2011; [id number]   BIRADS 2 BENIGN @ [facility];  NEG  . History of Papanicolaou smear of cervix 03/06/2013; 03/22/2016   -/-; -/-  .  Increased risk of breast cancer 03/2016   IBIS=24.4% PER [facility]  . Ovarian cyst 11/2013   RIGHT OV CYSTS  . Sickle cell trait ([place])   . Twin pregnancy delivered 05/15/2007   FEMALE/FEMALE @ 37WKS     Patient Active Problem List   Diagnosis Date Noted  . Vitamin D deficiency 01/15/2018  . Overweight (BMI 25.0-29.9) 01/15/2018  . Fear of public speaking 85/27/7824  . Increased risk of breast cancer 03/29/2016    Past Surgical History:  Procedure Laterality Date  . CESAREAN SECTION  05/15/2007   TWINS  . FOOT SURGERY  2006   LEFT    Family History        Family Status  Relation Name Status  . Mother  Alive  . Father  Deceased  . Sister  Alive  . Brother  Alive  . MGM  Deceased at age 32  . MGF  Deceased at age 23  . PGM   Deceased at age 29  . PGF  Deceased at age 51  . Mat [employer]  . Mat Uncle  Deceased  . Ethlyn Daniels  (Not Specified)  . Ethlyn Daniels  (Not Specified)  . Cousin  (Not Specified)  . Ethlyn Daniels  (Not Specified)        Her family history includes Aneurysm in her mother; Breast cancer (age of onset: 62) in her paternal aunt; Breast cancer (age of onset: 38) in her maternal aunt and paternal aunt; Cancer (age of onset: 2) in her father; Diabetes in her maternal uncle; Heart disease in her father; Hyperlipidemia in her mother; Hypertension in her mother and sister; Other in her cousin and sister; Ovarian cancer (age of onset: 64) in her paternal aunt; Stroke in her father.      No Known Allergies   Current Outpatient Medications:  .  Cholecalciferol (VITAMIN D) 2000 units CAPS, Take by mouth. Reported on 10/26/2015, Disp: , Rfl:  .  propranolol (INDERAL) 10 MG tablet, TAKE ONE HOUR BEFORE PRESENTATION, CAN REPEAT IF NEEDED 6 HOURS LATER, Disp: 30 tablet, Rfl: 2   Patient Care Team: [name], MD as PCP - General (Family Medicine)      Objective:   Vitals: BP 122/74   Pulse 71   Temp 98.4 F (36.9 C) (Oral)   Resp 18   Ht 5' 4.5" (1.638 m)   Wt 174 lb (78.9 kg)   LMP 12/27/2017 (Approximate)   SpO2 100%   BMI 29.41 kg/m    Vitals:   01/15/18 0915  BP: 122/74  Pulse: 71  Resp: 18  Temp: 98.4 F (36.9 C)  TempSrc: Oral  SpO2: 100%  Weight: 174 lb (78.9 kg)  Height: 5' 4.5" (1.638 m)     Physical Exam  Constitutional: She is oriented to person, place, and time. She appears well-developed and well-nourished.  Non-toxic appearance. No distress.  HENT:  Head: Normocephalic and atraumatic.  Right Ear: External ear normal.  Left Ear: External ear normal.  Nose: Nose normal.  Mouth/Throat: Uvula is midline, oropharynx is clear and moist and mucous membranes are normal.  Eyes: Pupils are equal, round, and reactive to light. Conjunctivae, EOM and lids are normal.  Neck:  Normal range of motion and phonation normal. Neck supple. No tracheal deviation present.  Cardiovascular: Normal rate, regular rhythm, normal heart sounds and normal pulses. Exam reveals no gallop and no friction rub.  No murmur heard. Pulses:      Radial pulses are 2+  on the right side, and 2+ on the left side.       Posterior tibial pulses are 2+ on the right side, and 2+ on the left side.  Pulmonary/Chest: Effort normal and breath sounds normal. No stridor. No respiratory distress. She has no wheezes. She has no rhonchi. She has no rales. She exhibits no tenderness.  Abdominal: Soft. Normal appearance and bowel sounds are normal. She exhibits no distension. There is no tenderness. There is no rebound and no guarding.  Musculoskeletal: Normal range of motion. She exhibits no edema or deformity.  Lymphadenopathy:    She has no cervical adenopathy.  Neurological: She is alert and oriented to person, place, and time. She exhibits normal muscle tone. Gait normal.  Skin: Skin is warm, dry and intact. Capillary refill takes less than 2 seconds. No rash noted. She is not diaphoretic. No pallor.  Psychiatric: She has a normal mood and affect. Her speech is normal and behavior is normal.     Depression Screen PHQ 2/9 Scores 01/15/2018 03/13/2016 08/17/2015  PHQ - 2 Score 0 0 0  PHQ- 9 Score - - 0     Office Visit from 01/15/2018 in [facility]  AUDIT-C Score  0        Assessment & Plan:     Routine Health Maintenance and Physical Exam  Exercise Activities and Dietary recommendations Goals   None   increase exercise 3x a week for 30 min  Immunization History  Administered Date(s) Administered  . Tdap 01/15/2018    Health Maintenance  Topic Date Due  . TETANUS/TDAP  05/29/2017  . INFLUENZA VACCINE  12/27/2017  . PAP SMEAR  03/23/2019  . HIV Screening  Completed     Discussed health benefits of physical activity, and encouraged her to engage in regular  exercise appropriate for her age and condition.    -------------------------------------------------------------------- PAP and mammogram UTP with OBGYN, obtaining PAP results  Tdap updated today Flu - return when available Fear of public speaking - propranolol refilled Overweight - discussed increasing exercise, healthy diet    ICD-10-CM   1. Routine general medical examination at a health care facility Z00.00 CBC with Differential/Platelet    COMPLETE METABOLIC PANEL WITH GFR    Lipid panel  2. Vitamin D deficiency E55.9 VITAMIN D 25 Hydroxy (Vit-D Deficiency, Fractures)  3. Overweight (BMI 25.0-29.9) E66.3 CBC with Differential/Platelet    COMPLETE METABOLIC PANEL WITH GFR    Lipid panel  4. Need for Tdap vaccination Z23 Tdap vaccine greater than or equal to 7yo IM  5. Fear of public speaking J69.678 propranolol (INDERAL) 10 MG tablet    Delsa Grana, PA-C 01/15/18 4:09 PM  [facility]

## 2018-01-15 NOTE — Patient Instructions (Addendum)
Health Maintenance, Female Adopting a healthy lifestyle and getting preventive care can go a long way to promote health and wellness. Talk with your health care provider about what schedule of regular examinations is right for you. This is a good chance for you to check in with your provider about disease prevention and staying healthy. In between checkups, there are plenty of things you can do on your own. Experts have done a lot of research about which lifestyle changes and preventive measures are most likely to keep you healthy. Ask your health care provider for more information. Weight and diet Eat a healthy diet  Be sure to include plenty of vegetables, fruits, low-fat dairy products, and lean protein.  Do not eat a lot of foods high in solid fats, added sugars, or salt.  Get regular exercise. This is one of the most important things you can do for your health. ? Most adults should exercise for at least 150 minutes each week. The exercise should increase your heart rate and make you sweat (moderate-intensity exercise). ? Most adults should also do strengthening exercises at least twice a week. This is in addition to the moderate-intensity exercise.  Maintain a healthy weight  Body mass index (BMI) is a measurement that can be used to identify possible weight problems. It estimates body fat based on height and weight. Your health care provider can help determine your BMI and help you achieve or maintain a healthy weight.  For females 20 years of age and older: ? A BMI below 18.5 is considered underweight. ? A BMI of 18.5 to 24.9 is normal. ? A BMI of 25 to 29.9 is considered overweight. ? A BMI of 30 and above is considered obese.  Watch levels of cholesterol and blood lipids  You should start having your blood tested for lipids and cholesterol at 44 years of age, then have this test every 5 years.  You may need to have your cholesterol levels checked more often if: ? Your lipid or  cholesterol levels are high. ? You are older than 44 years of age. ? You are at high risk for heart disease.  Cancer screening Lung Cancer  Lung cancer screening is recommended for adults 55-80 years old who are at high risk for lung cancer because of a history of smoking.  A yearly low-dose CT scan of the lungs is recommended for people who: ? Currently smoke. ? Have quit within the past 15 years. ? Have at least a 30-pack-year history of smoking. A pack year is smoking an average of one pack of cigarettes a day for 1 year.  Yearly screening should continue until it has been 15 years since you quit.  Yearly screening should stop if you develop a health problem that would prevent you from having lung cancer treatment.  Breast Cancer  Practice breast self-awareness. This means understanding how your breasts normally appear and feel.  It also means doing regular breast self-exams. Let your health care provider know about any changes, no matter how small.  If you are in your 20s or 30s, you should have a clinical breast exam (CBE) by a health care provider every 1-3 years as part of a regular health exam.  If you are 40 or older, have a CBE every year. Also consider having a breast X-ray (mammogram) every year.  If you have a family history of breast cancer, talk to your health care provider about genetic screening.  If you are at high risk   for breast cancer, talk to your health care provider about having an MRI and a mammogram every year.  Breast cancer gene (BRCA) assessment is recommended for women who have family members with BRCA-related cancers. BRCA-related cancers include: ? Breast. ? Ovarian. ? Tubal. ? Peritoneal cancers.  Results of the assessment will determine the need for genetic counseling and BRCA1 and BRCA2 testing.  Cervical Cancer Your health care provider may recommend that you be screened regularly for cancer of the pelvic organs (ovaries, uterus, and  vagina). This screening involves a pelvic examination, including checking for microscopic changes to the surface of your cervix (Pap test). You may be encouraged to have this screening done every 3 years, beginning at age 22.  For women ages 56-65, health care providers may recommend pelvic exams and Pap testing every 3 years, or they may recommend the Pap and pelvic exam, combined with testing for human papilloma virus (HPV), every 5 years. Some types of HPV increase your risk of cervical cancer. Testing for HPV may also be done on women of any age with unclear Pap test results.  Other health care providers may not recommend any screening for nonpregnant women who are considered low risk for pelvic cancer and who do not have symptoms. Ask your health care provider if a screening pelvic exam is right for you.  If you have had past treatment for cervical cancer or a condition that could lead to cancer, you need Pap tests and screening for cancer for at least 20 years after your treatment. If Pap tests have been discontinued, your risk factors (such as having a new sexual partner) need to be reassessed to determine if screening should resume. Some women have medical problems that increase the chance of getting cervical cancer. In these cases, your health care provider may recommend more frequent screening and Pap tests.  Colorectal Cancer  This type of cancer can be detected and often prevented.  Routine colorectal cancer screening usually begins at 44 years of age and continues through 44 years of age.  Your health care provider may recommend screening at an earlier age if you have risk factors for colon cancer.  Your health care provider may also recommend using home test kits to check for hidden blood in the stool.  A small camera at the end of a tube can be used to examine your colon directly (sigmoidoscopy or colonoscopy). This is done to check for the earliest forms of colorectal  cancer.  Routine screening usually begins at age 33.  Direct examination of the colon should be repeated every 5-10 years through 43 years of age. However, you may need to be screened more often if early forms of precancerous polyps or small growths are found.  Skin Cancer  Check your skin from head to toe regularly.  Tell your health care provider about any new moles or changes in moles, especially if there is a change in a mole's shape or color.  Also tell your health care provider if you have a mole that is larger than the size of a pencil eraser.  Always use sunscreen. Apply sunscreen liberally and repeatedly throughout the day.  Protect yourself by wearing long sleeves, pants, a wide-brimmed hat, and sunglasses whenever you are outside.  Heart disease, diabetes, and high blood pressure  High blood pressure causes heart disease and increases the risk of stroke. High blood pressure is more likely to develop in: ? People who have blood pressure in the high end of  the normal range (130-139/85-89 mm Hg). ? People who are overweight or obese. ? People who are African American.  If you are 21-29 years of age, have your blood pressure checked every 3-5 years. If you are 3 years of age or older, have your blood pressure checked every year. You should have your blood pressure measured twice-once when you are at a hospital or clinic, and once when you are not at a hospital or clinic. Record the average of the two measurements. To check your blood pressure when you are not at a hospital or clinic, you can use: ? An automated blood pressure machine at a pharmacy. ? A home blood pressure monitor.  If you are between 17 years and 37 years old, ask your health care provider if you should take aspirin to prevent strokes.  Have regular diabetes screenings. This involves taking a blood sample to check your fasting blood sugar level. ? If you are at a normal weight and have a low risk for diabetes,  have this test once every three years after 44 years of age. ? If you are overweight and have a high risk for diabetes, consider being tested at a younger age or more often. Preventing infection Hepatitis B  If you have a higher risk for hepatitis B, you should be screened for this virus. You are considered at high risk for hepatitis B if: ? You were born in a country where hepatitis B is common. Ask your health care provider which countries are considered high risk. ? Your parents were born in a high-risk country, and you have not been immunized against hepatitis B (hepatitis B vaccine). ? You have HIV or AIDS. ? You use needles to inject street drugs. ? You live with someone who has hepatitis B. ? You have had sex with someone who has hepatitis B. ? You get hemodialysis treatment. ? You take certain medicines for conditions, including cancer, organ transplantation, and autoimmune conditions.  Hepatitis C  Blood testing is recommended for: ? Everyone born from [birth year] through 1967. ? Anyone with known risk factors for hepatitis C.  Sexually transmitted infections (STIs)  You should be screened for sexually transmitted infections (STIs) including gonorrhea and chlamydia if: ? You are sexually active and are younger than 44 years of age. ? You are older than 44 years of age and your health care provider tells you that you are at risk for this type of infection. ? Your sexual activity has changed since you were last screened and you are at an increased risk for chlamydia or gonorrhea. Ask your health care provider if you are at risk.  If you do not have HIV, but are at risk, it may be recommended that you take a prescription medicine daily to prevent HIV infection. This is called pre-exposure prophylaxis (PrEP). You are considered at risk if: ? You are sexually active and do not regularly use condoms or know the HIV status of your partner(s). ? You take drugs by injection. ? You are  sexually active with a partner who has HIV.  Talk with your health care provider about whether you are at high risk of being infected with HIV. If you choose to begin PrEP, you should first be tested for HIV. You should then be tested every 3 months for as long as you are taking PrEP. Pregnancy  If you are premenopausal and you may become pregnant, ask your health care provider about preconception counseling.  If you may become  pregnant, take 400 to 800 micrograms (mcg) of folic acid every day.  If you want to prevent pregnancy, talk to your health care provider about birth control (contraception). Osteoporosis and menopause  Osteoporosis is a disease in which the bones lose minerals and strength with aging. This can result in serious bone fractures. Your risk for osteoporosis can be identified using a bone density scan.  If you are 50 years of age or older, or if you are at risk for osteoporosis and fractures, ask your health care provider if you should be screened.  Ask your health care provider whether you should take a calcium or vitamin D supplement to lower your risk for osteoporosis.  Menopause may have certain physical symptoms and risks.  Hormone replacement therapy may reduce some of these symptoms and risks. Talk to your health care provider about whether hormone replacement therapy is right for you. Follow these instructions at home:  Schedule regular health, dental, and eye exams.  Stay current with your immunizations.  Do not use any tobacco products including cigarettes, chewing tobacco, or electronic cigarettes.  If you are pregnant, do not drink alcohol.  If you are breastfeeding, limit how much and how often you drink alcohol.  Limit alcohol intake to no more than 1 drink per day for nonpregnant women. One drink equals 12 ounces of beer, 5 ounces of wine, or 1 ounces of hard liquor.  Do not use street drugs.  Do not share needles.  Ask your health care  provider for help if you need support or information about quitting drugs.  Tell your health care provider if you often feel depressed.  Tell your health care provider if you have ever been abused or do not feel safe at home. This information is not intended to replace advice given to you by your health care provider. Make sure you discuss any questions you have with your health care provider. Document Released: 11/28/2010 Document Revised: 10/21/2015 Document Reviewed: 02/16/2015 Elsevier Interactive Patient Education  2018 [employer].    Vitamin D Deficiency Vitamin D deficiency is when your body does not have enough vitamin D. Vitamin D is important because:  It helps your body use other minerals that your body needs.  It helps keep your bones strong and healthy.  It may help to prevent some diseases.  It helps your heart and other muscles work well.  You can get vitamin D by:  Eating foods with vitamin D in them.  Drinking or eating milk or other foods that have had vitamin D added to them.  Taking a vitamin D supplement.  Being in the sun.  Not getting enough vitamin D can make your bones become soft. It can also cause other health problems. Follow these instructions at home:  Take medicines and supplements only as told by your doctor.  Eat foods that have vitamin D. These include: ? Dairy products, cereals, or juices with added vitamin D. Check the label for vitamin D. ? Fatty fish like salmon or trout. ? Eggs. ? Oysters.  Do not use tanning beds.  Stay at a healthy weight. Lose weight, if needed.  Keep all follow-up visits as told by your doctor. This is important. Contact a doctor if:  Your symptoms do not go away.  You feel sick to your stomach (nauseous).  Youthrow up (vomit).  You poop less often than usual or you have trouble pooping (constipation). This information is not intended to replace advice given to you by  your health care provider.  Make sure you discuss any questions you have with your health care provider. Document Released: 05/04/2011 Document Revised: 10/21/2015 Document Reviewed: 09/30/2014 Elsevier Interactive Patient Education  [employer].

## 2018-01-16 LAB — COMPLETE METABOLIC PANEL WITH GFR
AG RATIO: 1.3 (calc) (ref 1.0–2.5)
ALT: 7 U/L (ref 6–29)
AST: 12 U/L (ref 10–30)
Albumin: 4 g/dL (ref 3.6–5.1)
Alkaline phosphatase (APISO): 52 U/L (ref 33–115)
BILIRUBIN TOTAL: 1 mg/dL (ref 0.2–1.2)
BUN: 9 mg/dL (ref 7–25)
CHLORIDE: 106 mmol/L (ref 98–110)
CO2: 21 mmol/L (ref 20–32)
Calcium: 8.8 mg/dL (ref 8.6–10.2)
Creat: 0.77 mg/dL (ref 0.50–1.10)
GFR, EST AFRICAN AMERICAN: 110 mL/min/{1.73_m2} (ref >=60)
GFR, Est Non African American: 95 mL/min/{1.73_m2} (ref >=60)
GLOBULIN: 3.2 g/dL (ref 1.9–3.7)
Glucose, Bld: 82 mg/dL (ref 65–99)
POTASSIUM: 3.9 mmol/L (ref 3.5–5.3)
SODIUM: 137 mmol/L (ref 135–146)
TOTAL PROTEIN: 7.2 g/dL (ref 6.1–8.1)

## 2018-01-16 LAB — CBC WITH DIFFERENTIAL/PLATELET
Basophils Absolute: 39 cells/uL (ref 0–200)
Basophils Relative: 0.9 %
EOS ABS: 82 {cells}/uL (ref 15–500)
Eosinophils Relative: 1.9 %
HEMATOCRIT: 35.2 % (ref 35.0–45.0)
Hemoglobin: 11.7 g/dL (ref 11.7–15.5)
Lymphs Abs: 1496 cells/uL (ref 850–3900)
MCH: 26.3 pg — AB (ref 27.0–33.0)
MCHC: 33.2 g/dL (ref 32.0–36.0)
MCV: 79.1 fL — ABNORMAL LOW (ref 80.0–100.0)
MPV: 11 fL (ref 7.5–12.5)
Monocytes Relative: 8 %
NEUTROS PCT: 54.4 %
Neutro Abs: 2339 cells/uL (ref 1500–7800)
PLATELETS: 324 10*3/uL (ref 140–400)
RBC: 4.45 10*6/uL (ref 3.80–5.10)
RDW: 14.3 % (ref 11.0–15.0)
TOTAL LYMPHOCYTE: 34.8 %
WBC: 4.3 10*3/uL (ref 3.8–10.8)
WBCMIX: 344 {cells}/uL (ref 200–950)

## 2018-01-16 LAB — LIPID PANEL
Cholesterol: 161 mg/dL (ref <200)
HDL: 49 mg/dL — ABNORMAL LOW (ref >50)
LDL Cholesterol (Calc): 91 mg/dL (calc)
NON-HDL CHOLESTEROL (CALC): 112 mg/dL (ref <130)
Total CHOL/HDL Ratio: 3.3 (calc) (ref <5.0)
Triglycerides: 115 mg/dL (ref <150)

## 2018-01-16 LAB — VITAMIN D 25 HYDROXY (VIT D DEFICIENCY, FRACTURES): VIT D 25 HYDROXY: 17 ng/mL — AB (ref 30–100)

## 2018-05-29 HISTORY — PX: WISDOM TOOTH EXTRACTION: SHX21

## 2018-12-12 ENCOUNTER — Other Ambulatory Visit: Payer: Self-pay | Admitting: Family Medicine

## 2018-12-12 DIAGNOSIS — F40248 Other situational type phobia: Secondary | ICD-10-CM

## 2019-01-13 NOTE — Progress Notes (Signed)
PCP:  [name], MD   Chief Complaint  Patient presents with  . Gynecologic Exam     HPI:      [name] is a 43 y.o. G2P2003 who LMP was Patient's last menstrual period was 01/07/2019 (exact date)., presents today for her annual examination.  Her menses are regular every 28-30 days, lasting 4-5 days.  Dysmenorrhea mild, occurring first 1-2 days of flow. She does not have intermenstrual bleeding. Stopped OCPs last yr and menses are normal.   Sex activity: single partner, contraception -vasectomy.  Last Pap: March 22, 2016  Results were: no abnormalities /neg HPV DNA  Hx of STDs: none  Last mammogram:07/12/17 Results: no abnormalities, repeat in 12 months There is a FH of breast cancer in her mat aunt and 2 pat aunts. There is a FH of ovarian cancer in a 3rd pat aunt. Pt is MyRisk neg except ATM VUS. The patient does do self-breast exams. IBIS=24%. Taking Vit D supp. Has not had scr breast MRI as discussed previously.  Tobacco use: The patient denies current or previous tobacco use. Alcohol use: none No drug use.  Exercise: moderately active  She does not get adequate calcium but does get Vitamin D in her diet.  Labs with PCP.  Past Medical History:  Diagnosis Date  . Blood type, Rh positive   . BRCA negative 03/2016   MyRisk neg except ATM VUS  . Family history of breast cancer 03/2016   MyRisk neg  . Family history of ovarian cancer    PAT AUNT  . Genetic testing of female 03/2016   ATM VUS  . History of mammogram 06/21/2011; [id number]   BIRADS 2 BENIGN @ [place];  NEG  . History of Papanicolaou smear of cervix 03/06/2013; 03/22/2016   -/-; -/-  . Increased risk of breast cancer 03/2016   IBIS=24.4% PER [facility]  . Ovarian cyst 11/2013   RIGHT OV CYSTS  . Sickle cell trait ([place])   . Twin pregnancy delivered 05/15/2007   FEMALE/FEMALE @ 37WKS    Past Surgical History:  Procedure Laterality Date  . CESAREAN SECTION  05/15/2007   TWINS  . FOOT  SURGERY  2006   LEFT    Family History  Problem Relation Age of Onset  . Hypertension Mother   . Aneurysm Mother   . Hyperlipidemia Mother   . Cancer Father 63       Lung/Cancer   . Stroke Father   . Heart disease Father        Heart attack at death   . Hypertension Sister   . Other Sister        TWIN PREG  . Breast cancer Maternal Aunt 60  . Diabetes Maternal Uncle   . Breast cancer Paternal Aunt 44  . Breast cancer Paternal Aunt 65  . Other Cousin        TWIN PREG  . Ovarian cancer Paternal Aunt 29    Social History   Socioeconomic History  . Marital status: Married    Spouse name: Not on file  . Number of children: 3  . Years of education: 38  . Highest education level: Not on file  Occupational History  . Occupation: [employer]  . Financial resource strain: Not on file  . Food insecurity    Worry: Not on file    Inability: Not on file  . Transportation needs    Medical: Not on file    Non-medical:  Not on file  Tobacco Use  . Smoking status: Never Smoker  . Smokeless tobacco: Never Used  Substance and Sexual Activity  . Alcohol use: No  . Drug use: No  . Sexual activity: Yes    Birth control/protection: None, Surgical    Comment: Vasectomy  Lifestyle  . Physical activity    Days per week: Not on file    Minutes per session: Not on file  . Stress: Not on file  Relationships  . Social Herbalist on phone: Not on file    Gets together: Not on file    Attends religious service: Not on file    Active member of club or organization: Not on file    Attends meetings of clubs or organizations: Not on file    Relationship status: Not on file  . Intimate partner violence    Fear of current or ex partner: Not on file    Emotionally abused: Not on file    Physically abused: Not on file    Forced sexual activity: Not on file  Other Topics Concern  . Not on file  Social History Narrative  . Not on file    Current Meds   Medication Sig  . propranolol (INDERAL) 10 MG tablet TAKE 1 HOUR BEFORE REPRESENTATION, CAN REPEAT IF NEEDED 6 HOURS LATER     ROS:  Review of Systems  Constitutional: Negative for fatigue, fever and unexpected weight change.  Respiratory: Negative for cough, shortness of breath and wheezing.   Cardiovascular: Negative for chest pain, palpitations and leg swelling.  Gastrointestinal: Negative for blood in stool, constipation, diarrhea, nausea and vomiting.  Endocrine: Negative for cold intolerance, heat intolerance and polyuria.  Genitourinary: Negative for dyspareunia, dysuria, flank pain, frequency, genital sores, hematuria, menstrual problem, pelvic pain, urgency, vaginal bleeding, vaginal discharge and vaginal pain.  Musculoskeletal: Negative for back pain, joint swelling and myalgias.  Skin: Negative for rash.  Neurological: Negative for dizziness, syncope, light-headedness, numbness and headaches.  Hematological: Negative for adenopathy.  Psychiatric/Behavioral: Positive for agitation. Negative for confusion, sleep disturbance and suicidal ideas. The patient is not nervous/anxious.      Objective: BP 110/80   Ht 5' 6"  (1.676 m)   Wt 173 lb 3.2 oz (78.6 kg)   LMP 01/07/2019 (Exact Date)   BMI 27.96 kg/m    Physical Exam Constitutional:      Appearance: She is well-developed.  Genitourinary:     Vulva, vagina, uterus, right adnexa and left adnexa normal.     No vulval lesion or tenderness noted.     No vaginal discharge, erythema or tenderness.     No cervical motion tenderness or polyp.     Uterus is not enlarged or tender.     No right or left adnexal mass present.     Right adnexa not tender.     Left adnexa not tender.  Neck:     Musculoskeletal: Normal range of motion.     Thyroid: No thyromegaly.  Cardiovascular:     Rate and Rhythm: Normal rate and regular rhythm.     Heart sounds: Normal heart sounds. No murmur.  Pulmonary:     Effort: Pulmonary effort  is normal.     Breath sounds: Normal breath sounds.  Chest:     Breasts:        Right: No mass, nipple discharge, skin change or tenderness.        Left: No mass, nipple discharge, skin change or  tenderness.  Abdominal:     Palpations: Abdomen is soft.     Tenderness: There is no abdominal tenderness. There is no guarding.  Musculoskeletal: Normal range of motion.  Neurological:     General: No focal deficit present.     Mental Status: She is alert and oriented to person, place, and time.     Cranial Nerves: No cranial nerve deficit.  Skin:    General: Skin is warm and dry.  Psychiatric:        Mood and Affect: Mood normal.        Behavior: Behavior normal.        Thought Content: Thought content normal.        Judgment: Judgment normal.  Vitals signs reviewed.    Assessment/Plan: Encounter for annual routine gynecological examination -   Screening for breast cancer - Plan: MM 3D SCREEN BREAST BILATERAL, pt to sched mammo  Family history of breast cancer - Plan: MM 3D SCREEN BREAST BILATERAL, MyRisk neg  Increased risk of breast cancer - Plan: MM 3D SCREEN BREAST BILATERAL, Cont monthly SBE, yearly CBE and mammos. Discussed scr breast MRI--pt to f/u if desires. Cont Vit D supp.          GYN counsel breast self exam, mammography screening, adequate intake of calcium and vitamin D, diet and exercise     F/U  Return in about 1 year (around 01/14/2020).   B. , PA-C 01/14/2019 10:00 AM

## 2019-01-13 NOTE — Patient Instructions (Addendum)
I value your feedback and entrusting us with your care. If you get a [facility] patient survey, I would appreciate you taking the time to let us know about your experience today. Thank you!  [facility]: [phone]    

## 2019-01-14 ENCOUNTER — Other Ambulatory Visit: Payer: Self-pay

## 2019-01-14 ENCOUNTER — Ambulatory Visit (INDEPENDENT_AMBULATORY_CARE_PROVIDER_SITE_OTHER): Payer: BLUE CROSS/BLUE SHIELD | Admitting: Obstetrics and Gynecology

## 2019-01-14 ENCOUNTER — Encounter: Payer: Self-pay | Admitting: Obstetrics and Gynecology

## 2019-01-14 VITALS — BP 110/80 | Ht 66.0 in | Wt 173.2 lb

## 2019-01-14 DIAGNOSIS — Z9189 Other specified personal risk factors, not elsewhere classified: Secondary | ICD-10-CM

## 2019-01-14 DIAGNOSIS — Z1239 Encounter for other screening for malignant neoplasm of breast: Secondary | ICD-10-CM

## 2019-01-14 DIAGNOSIS — Z01419 Encounter for gynecological examination (general) (routine) without abnormal findings: Secondary | ICD-10-CM | POA: Diagnosis not present

## 2019-01-14 DIAGNOSIS — Z803 Family history of malignant neoplasm of breast: Secondary | ICD-10-CM

## 2019-02-07 ENCOUNTER — Other Ambulatory Visit: Payer: Self-pay

## 2019-02-07 ENCOUNTER — Ambulatory Visit (INDEPENDENT_AMBULATORY_CARE_PROVIDER_SITE_OTHER): Payer: BLUE CROSS/BLUE SHIELD | Admitting: Family Medicine

## 2019-02-07 ENCOUNTER — Encounter: Payer: Self-pay | Admitting: Family Medicine

## 2019-02-07 VITALS — BP 102/68 | HR 66 | Temp 98.4°F | Resp 12 | Ht 66.0 in | Wt 173.0 lb

## 2019-02-07 DIAGNOSIS — F40248 Other situational type phobia: Secondary | ICD-10-CM | POA: Diagnosis not present

## 2019-02-07 DIAGNOSIS — Z23 Encounter for immunization: Secondary | ICD-10-CM

## 2019-02-07 DIAGNOSIS — E559 Vitamin D deficiency, unspecified: Secondary | ICD-10-CM | POA: Diagnosis not present

## 2019-02-07 DIAGNOSIS — Z0001 Encounter for general adult medical examination with abnormal findings: Secondary | ICD-10-CM | POA: Diagnosis not present

## 2019-02-07 DIAGNOSIS — Z0000 Encounter for general adult medical examination without abnormal findings: Secondary | ICD-10-CM

## 2019-02-07 MED ORDER — PROPRANOLOL HCL 10 MG PO TABS: ORAL_TABLET | ORAL | 1 refills | Status: DC

## 2019-02-07 NOTE — Progress Notes (Signed)
   Subjective:    Patient ID: Rebecca Sexton, female    DOB: 02-May-1976, 43 y.o.   MRN: [id number]  Patient presents for Annual Exam (is fasting)   Pt here for CPE    Medications and history reviewed   [facility]   Pt tp Schedule mammogram     Had numbness in Toes both lasted a week , now resolved , no other symptoms associated with it    Taking Vitamin D      Propranolol used for public speaking    Immunizations- Due for flu shot   Review Of Systems:  GEN- denies fatigue, fever, weight loss,weakness, recent illness HEENT- denies eye drainage, change in vision, nasal discharge, CVS- denies chest pain, palpitations RESP- denies SOB, cough, wheeze ABD- denies N/V, change in stools, abd pain GU- denies dysuria, hematuria, dribbling, incontinence MSK- denies joint pain, muscle aches, injury Neuro- denies headache, dizziness, syncope, seizure activity       Objective:    BP 102/68   Pulse 66   Temp 98.4 F (36.9 C) (Oral)   Resp 12   Ht 5\' 6"  (1.676 m)   Wt 173 lb (78.5 kg)   LMP 02/02/2019 Comment: regular  SpO2 100%   BMI 27.92 kg/m  GEN- NAD, alert and oriented x3 HEENT- PERRL, EOMI, non injected sclera, pink conjunctiva, MMM, oropharynx clear, TM clear bilat, no effusion Neck- Supple, no thyromegaly CVS- RRR, no murmur RESP-CTAB ABD-NABS,soft,NT,ND EXT- No edema Neuro-CNII-XII in tact, normal monofilament bilat LE, normal strength and sensation Pulses- Radial, DP- 2+   Fall/depression/audit C negative     Assessment & Plan:      Problem List Items Addressed This Visit      Unprioritized   Fear of public speaking   Relevant Medications   propranolol (INDERAL) 10 MG tablet   Vitamin D deficiency   Relevant Orders   Vitamin D, 25-hydroxy (Completed)    Other Visit Diagnoses    Routine general medical examination at a health care facility    -  Primary   CPE done, fasting labs, GYN recently for PAP, pt to schedule mammo, flu shot given,  continue propranolol prn public speaking events   Relevant Orders   CBC with Differential/Platelet (Completed)   Comprehensive metabolic panel (Completed)   Lipid panel (Completed)   Need for immunization against influenza       Relevant Orders   Flu Vaccine QUAD 36+ mos IM (Completed)      Note: This dictation was prepared with Dragon dictation along with smaller phrase technology. Any transcriptional errors that result from this process are unintentional.

## 2019-02-07 NOTE — Patient Instructions (Signed)
F/U  1 year for Physical Flu shot given

## 2019-02-08 LAB — CBC WITH DIFFERENTIAL/PLATELET
Absolute Monocytes: 222 cells/uL (ref 200–950)
Basophils Absolute: 52 cells/uL (ref 0–200)
Basophils Relative: 1.4 %
Eosinophils Absolute: 41 cells/uL (ref 15–500)
Eosinophils Relative: 1.1 %
HCT: 33.5 % — ABNORMAL LOW (ref 35.0–45.0)
Hemoglobin: 10.7 g/dL — ABNORMAL LOW (ref 11.7–15.5)
Lymphs Abs: 1543 cells/uL (ref 850–3900)
MCH: 25.7 pg — ABNORMAL LOW (ref 27.0–33.0)
MCHC: 31.9 g/dL — ABNORMAL LOW (ref 32.0–36.0)
MCV: 80.3 fL (ref 80.0–100.0)
MPV: 11.1 fL (ref 7.5–12.5)
Monocytes Relative: 6 %
Neutro Abs: 1843 cells/uL (ref 1500–7800)
Neutrophils Relative %: 49.8 %
Platelets: 340 10*3/uL (ref 140–400)
RBC: 4.17 10*6/uL (ref 3.80–5.10)
RDW: 16 % — ABNORMAL HIGH (ref 11.0–15.0)
Total Lymphocyte: 41.7 %
WBC: 3.7 10*3/uL — ABNORMAL LOW (ref 3.8–10.8)

## 2019-02-08 LAB — COMPREHENSIVE METABOLIC PANEL
AG Ratio: 1.3 (calc) (ref 1.0–2.5)
ALT: 7 U/L (ref 6–29)
AST: 11 U/L (ref 10–30)
Albumin: 3.8 g/dL (ref 3.6–5.1)
Alkaline phosphatase (APISO): 53 U/L (ref 31–125)
BUN: 9 mg/dL (ref 7–25)
CO2: 22 mmol/L (ref 20–32)
Calcium: 8.7 mg/dL (ref 8.6–10.2)
Chloride: 108 mmol/L (ref 98–110)
Creat: 0.79 mg/dL (ref 0.50–1.10)
Globulin: 2.9 g/dL (calc) (ref 1.9–3.7)
Glucose, Bld: 82 mg/dL (ref 65–99)
Potassium: 4.1 mmol/L (ref 3.5–5.3)
Sodium: 139 mmol/L (ref 135–146)
Total Bilirubin: 0.7 mg/dL (ref 0.2–1.2)
Total Protein: 6.7 g/dL (ref 6.1–8.1)

## 2019-02-08 LAB — LIPID PANEL
Cholesterol: 151 mg/dL (ref <200)
HDL: 48 mg/dL — ABNORMAL LOW (ref >=50)
LDL Cholesterol (Calc): 82 mg/dL (calc)
Non-HDL Cholesterol (Calc): 103 mg/dL (calc) (ref <130)
Total CHOL/HDL Ratio: 3.1 (calc) (ref <5.0)
Triglycerides: 118 mg/dL (ref <150)

## 2019-02-08 LAB — VITAMIN D 25 HYDROXY (VIT D DEFICIENCY, FRACTURES): Vit D, 25-Hydroxy: 27 ng/mL — ABNORMAL LOW (ref 30–100)

## 2019-02-09 ENCOUNTER — Encounter: Payer: Self-pay | Admitting: Family Medicine

## 2019-02-13 ENCOUNTER — Other Ambulatory Visit: Payer: Self-pay | Admitting: *Deleted

## 2019-02-13 DIAGNOSIS — D649 Anemia, unspecified: Secondary | ICD-10-CM

## 2019-06-19 ENCOUNTER — Encounter: Payer: Self-pay | Admitting: Family Medicine

## 2019-09-01 ENCOUNTER — Ambulatory Visit
Admission: EM | Admit: 2019-09-01 | Discharge: 2019-09-01 | Disposition: A | Discharge status: Home / Self Care | Payer: 59

## 2019-09-01 ENCOUNTER — Other Ambulatory Visit: Payer: Self-pay

## 2019-09-01 ENCOUNTER — Encounter: Payer: Self-pay | Admitting: Emergency Medicine

## 2019-09-01 DIAGNOSIS — Z20822 Contact with and (suspected) exposure to covid-19: Secondary | ICD-10-CM

## 2019-09-01 NOTE — ED Triage Notes (Signed)
Sore throat 08/27/2019 Received vaccine covid #2 on 08/28/2019 Patient has not felt well since .  Husband tested positive for covid today

## 2019-09-01 NOTE — ED Provider Notes (Signed)
[facility]    CSN: [id number] Arrival date & time: 09/01/19  1132      History   Chief Complaint Chief Complaint  Patient presents with  . Labs Only    HPI Rebecca Sexton is a 44 y.o. female   Presenting for Covid testing: Exposure: Husband who tested positive today, had symptoms since last Thursday Date of exposure: Chronic/cohabitate Any fever, symptoms since exposure: Yes-sore throat since 08/27/2019.  Patient received second Covid vaccine 4/1.  Endorsing malaise since.  She also endorsing fever Friday and yesterday: T-max 101F.  No cough, shortness of breath, chest pain or fever.  Not currently taking anything for symptoms.   Past Medical History:  Diagnosis Date  . Blood type, Rh positive   . BRCA negative 03/2016   MyRisk neg except ATM VUS  . Family history of breast cancer 03/2016   MyRisk neg  . Family history of ovarian cancer    PAT AUNT  . Genetic testing of female 03/2016   ATM VUS  . History of mammogram 06/21/2011; [id number]   BIRADS 2 BENIGN @ [place];  NEG  . History of Papanicolaou smear of cervix 03/06/2013; 03/22/2016   -/-; -/-  . Increased risk of breast cancer 03/2016   IBIS=24.4% PER [facility]  . Ovarian cyst 11/2013   RIGHT OV CYSTS  . Sickle cell trait ([place])   . Twin pregnancy delivered 05/15/2007   FEMALE/FEMALE @ 37WKS    Patient Active Problem List   Diagnosis Date Noted  . Vitamin D deficiency 01/15/2018  . Overweight (BMI 25.0-29.9) 01/15/2018  . Fear of public speaking 64/40/3474  . Increased risk of breast cancer 03/29/2016    Past Surgical History:  Procedure Laterality Date  . CESAREAN SECTION  05/15/2007   TWINS  . FOOT SURGERY  2006   LEFT    OB History    Gravida  2   Para  2   Term  2   Preterm      AB      Living  3     SAB      TAB      Ectopic      Multiple  1   Live Births  3            Home Medications    Prior to Admission medications   Medication Sig Start Date End Date  Taking? Authorizing Provider  acetaminophen (TYLENOL) 325 MG tablet Take 650 mg by mouth every 6 (six) hours as needed.   Yes [provider]  Cholecalciferol (VITAMIN D) 2000 units CAPS Take by mouth. Reported on 10/26/2015   Yes [provider]  propranolol (INDERAL) 10 MG tablet TAKE 1 HOUR BEFORE REPRESENTATION, CAN REPEAT IF NEEDED 6 HOURS LATER 02/07/19   [name], MD    Family History Family History  Problem Relation Age of Onset  . Hypertension Mother   . Aneurysm Mother   . Hyperlipidemia Mother   . Cancer Father 74       Lung/Cancer   . Stroke Father   . Heart disease Father        Heart attack at death   . Hypertension Sister   . Other Sister        TWIN PREG  . Breast cancer Maternal Aunt 60  . Diabetes Maternal Uncle   . Breast cancer Paternal Aunt 67  . Breast cancer Paternal Aunt 54  . Other Cousin  TWIN PREG  . Ovarian cancer Paternal Aunt 84    Social History Social History   Tobacco Use  . Smoking status: Never Smoker  . Smokeless tobacco: Never Used  Substance Use Topics  . Alcohol use: No  . Drug use: No     Allergies   Patient has no known allergies.   Review of Systems As per HPI   Physical Exam Triage Vital Signs ED Triage Vitals  Enc Vitals Group     BP      Pulse      Resp      Temp      Temp src      SpO2      Weight      Height      Head Circumference      Peak Flow      Pain Score      Pain Loc      Pain Edu?      Excl. in [place]?    No data found.  Updated Vital Signs BP 118/79 (BP Location: Left Arm)   Pulse 79   Temp 98.9 F (37.2 C) (Oral)   Resp 16   LMP 08/16/2019   SpO2 99%   Visual Acuity Right Eye Distance:   Left Eye Distance:   Bilateral Distance:    Right Eye Near:   Left Eye Near:    Bilateral Near:     Physical Exam Constitutional:      General: She is not in acute distress.    Appearance: She is not ill-appearing.  HENT:     Head: Normocephalic and  atraumatic.     Mouth/Throat:     Mouth: Mucous membranes are moist.     Pharynx: Oropharynx is clear. No oropharyngeal exudate or posterior oropharyngeal erythema.  Eyes:     General: No scleral icterus.    Conjunctiva/sclera: Conjunctivae normal.     Pupils: Pupils are equal, round, and reactive to light.  Cardiovascular:     Rate and Rhythm: Normal rate and regular rhythm.  Pulmonary:     Effort: Pulmonary effort is normal. No respiratory distress.     Breath sounds: No wheezing.  Musculoskeletal:     Cervical back: Neck supple. No tenderness.  Lymphadenopathy:     Cervical: No cervical adenopathy.  Skin:    Coloration: Skin is not jaundiced or pale.     Findings: No rash.  Neurological:     Mental Status: She is alert and oriented to person, place, and time.      UC Treatments / Results  Labs (all labs ordered are listed, but only abnormal results are displayed) Labs Reviewed  NOVEL CORONAVIRUS, NAA    EKG   Radiology No results found.  Procedures Procedures (including critical care time)  Medications Ordered in UC Medications - No data to display  Initial Impression / Assessment and Plan / UC Course  I have reviewed the triage vital signs and the nursing notes.  Pertinent labs & imaging results that were available during my care of the patient were reviewed by me and considered in my medical decision making (see chart for details).     Patient afebrile, nontoxic, with SpO2 99%.  Covid PCR pending.  Patient to quarantine until results are back.  We will continue supportive management.  Return precautions discussed, patient verbalized understanding and is agreeable to plan. Final Clinical Impressions(s) / UC Diagnoses   Final diagnoses:  Close exposure to COVID-19 virus  Discharge Instructions     Your COVID test is pending - it is important to quarantine / isolate at home until your results are back. If you test positive and would like further  evaluation for persistent or worsening symptoms, you may schedule an E-visit or virtual (video) visit throughout the [facility] app or website.  PLEASE NOTE: If you develop severe chest pain or shortness of breath please go to the ER or call 9-1-1 for further evaluation --> DO NOT schedule electronic or virtual visits for this. Please call our office for further guidance / recommendations as needed.  For information about the Covid vaccine, please visit [email or web address]    ED Prescriptions    None     PDMP not reviewed this encounter.   [place], [place], [place] 09/02/19 1059

## 2019-09-01 NOTE — Discharge Instructions (Signed)
Your COVID test is pending - it is important to quarantine / isolate at home until your results are back. °If you test positive and would like further evaluation for persistent or worsening symptoms, you may schedule an E-visit or virtual (video) visit throughout the [facility] MyChart app or website. ° °PLEASE NOTE: If you develop severe chest pain or shortness of breath please go to the ER or call 9-1-1 for further evaluation --> DO NOT schedule electronic or virtual visits for this. °Please call our office for further guidance / recommendations as needed. ° °For information about the Covid vaccine, please visit [email or web address] °

## 2019-09-03 LAB — SARS-COV-2, NAA 2 DAY TAT

## 2019-09-03 LAB — NOVEL CORONAVIRUS, NAA: SARS-CoV-2, NAA: NOT DETECTED

## 2019-10-11 ENCOUNTER — Ambulatory Visit
Admission: EM | Admit: 2019-10-11 | Discharge: 2019-10-11 | Disposition: A | Discharge status: Home / Self Care | Payer: 59 | Attending: Physician Assistant | Admitting: Physician Assistant

## 2019-10-11 ENCOUNTER — Other Ambulatory Visit: Payer: Self-pay

## 2019-10-11 ENCOUNTER — Encounter: Payer: Self-pay | Admitting: Physician Assistant

## 2019-10-11 DIAGNOSIS — N3090 Cystitis, unspecified without hematuria: Secondary | ICD-10-CM | POA: Diagnosis not present

## 2019-10-11 LAB — POCT URINALYSIS DIP (MANUAL ENTRY)
Bilirubin, UA: NEGATIVE
Glucose, UA: NEGATIVE mg/dL
Ketones, POC UA: NEGATIVE mg/dL
Nitrite, UA: NEGATIVE
Protein Ur, POC: 100 mg/dL — AB
Spec Grav, UA: >=1.03 — AB (ref 1.010–1.025)
Urobilinogen, UA: 0.2 E.U./dL
pH, UA: 5 (ref 5.0–8.0)

## 2019-10-11 MED ORDER — CEPHALEXIN 500 MG PO CAPS: 500.0000 mg | ORAL_CAPSULE | Freq: Two times a day (BID) | ORAL | 0 refills | Status: DC

## 2019-10-11 NOTE — Discharge Instructions (Signed)
Your urine was positive for an urinary tract infection. Start keflex as directed. Keep hydrated, urine should be clear to pale yellow in color. Monitor for any worsening of symptoms, fever, worsening abdominal pain, nausea/vomiting, flank pain, follow up for reevaluation.  ° °

## 2019-10-11 NOTE — ED Provider Notes (Signed)
[facility]    CSN: [id number] Arrival date & time: 10/11/19  6720      History   Chief Complaint No chief complaint on file.   HPI Rebecca Sexton is a 44 y.o. female.   44 year old female comes in for 2 day history of urinary symptoms. Has had frequency, dysuria, urgency. Denies hematuria. Denies abdominal pain, nausea, vomiting. Denies fever, chills, flank/back pain. Denies vaginal discharge, itching, spotting. LMP 09/16/2019, no worries for pregnancy (husband has vasectomy)      Past Medical History:  Diagnosis Date  . Blood type, Rh positive   . BRCA negative 03/2016   MyRisk neg except ATM VUS  . Family history of breast cancer 03/2016   MyRisk neg  . Family history of ovarian cancer    PAT AUNT  . Genetic testing of female 03/2016   ATM VUS  . History of mammogram 06/21/2011; [id number]   BIRADS 2 BENIGN @ [place];  NEG  . History of Papanicolaou smear of cervix 03/06/2013; 03/22/2016   -/-; -/-  . Increased risk of breast cancer 03/2016   IBIS=24.4% PER [facility]  . Ovarian cyst 11/2013   RIGHT OV CYSTS  . Sickle cell trait ([place])   . Twin pregnancy delivered 05/15/2007   FEMALE/FEMALE @ 37WKS    Patient Active Problem List   Diagnosis Date Noted  . Vitamin D deficiency 01/15/2018  . Overweight (BMI 25.0-29.9) 01/15/2018  . Fear of public speaking 36/62/9476  . Increased risk of breast cancer 03/29/2016    Past Surgical History:  Procedure Laterality Date  . CESAREAN SECTION  05/15/2007   TWINS  . FOOT SURGERY  2006   LEFT    OB History    Gravida  2   Para  2   Term  2   Preterm      AB      Living  3     SAB      TAB      Ectopic      Multiple  1   Live Births  3            Home Medications    Prior to Admission medications   Medication Sig Start Date End Date Taking? Authorizing Provider  acetaminophen (TYLENOL) 325 MG tablet Take 650 mg by mouth every 6 (six) hours as needed.    [provider]    cephALEXin (KEFLEX) 500 MG capsule Take 1 capsule (500 mg total) by mouth 2 (two) times daily. 10/11/19   Ok Edwards, PA-C  Cholecalciferol (VITAMIN D) 2000 units CAPS Take by mouth. Reported on 10/26/2015    [provider]  propranolol (INDERAL) 10 MG tablet TAKE 1 HOUR BEFORE REPRESENTATION, CAN REPEAT IF NEEDED 6 HOURS LATER 02/07/19   [name], MD    Family History Family History  Problem Relation Age of Onset  . Hypertension Mother   . Aneurysm Mother   . Hyperlipidemia Mother   . Cancer Father 11       Lung/Cancer   . Stroke Father   . Heart disease Father        Heart attack at death   . Hypertension Sister   . Other Sister        TWIN PREG  . Breast cancer Maternal Aunt 60  . Diabetes Maternal Uncle   . Breast cancer Paternal Aunt 84  . Breast cancer Paternal Aunt 16  . Other Cousin  TWIN PREG  . Ovarian cancer Paternal Aunt 41    Social History Social History   Tobacco Use  . Smoking status: Never Smoker  . Smokeless tobacco: Never Used  Substance Use Topics  . Alcohol use: No  . Drug use: No     Allergies   Patient has no known allergies.   Review of Systems Review of Systems  Reason unable to perform ROS: See HPI as above.     Physical Exam Triage Vital Signs ED Triage Vitals [10/11/19 0829]  Enc Vitals Group     BP 119/86     Pulse Rate 82     Resp 16     Temp 98.4 F (36.9 C)     Temp src      SpO2 100 %     Weight      Height      Head Circumference      Peak Flow      Pain Score      Pain Loc      Pain Edu?      Excl. in [place]?    No data found.  Updated Vital Signs BP 119/86 (BP Location: Left Arm)   Pulse 82   Temp 98.4 F (36.9 C)   Resp 16   SpO2 100%   Physical Exam Constitutional:      General: She is not in acute distress.    Appearance: She is well-developed. She is not ill-appearing, toxic-appearing or diaphoretic.  HENT:     Head: Normocephalic and atraumatic.  Eyes:      Conjunctiva/sclera: Conjunctivae normal.     Pupils: Pupils are equal, round, and reactive to light.  Cardiovascular:     Rate and Rhythm: Normal rate and regular rhythm.  Pulmonary:     Effort: Pulmonary effort is normal. No respiratory distress.     Comments: LCTAB Abdominal:     General: Bowel sounds are normal.     Palpations: Abdomen is soft.     Tenderness: There is no abdominal tenderness. There is no right CVA tenderness, left CVA tenderness, guarding or rebound.  Musculoskeletal:     Cervical back: Normal range of motion and neck supple.  Skin:    General: Skin is warm and dry.  Neurological:     Mental Status: She is alert and oriented to person, place, and time.  Psychiatric:        Behavior: Behavior normal.        Judgment: Judgment normal.      UC Treatments / Results  Labs (all labs ordered are listed, but only abnormal results are displayed) Labs Reviewed  POCT URINALYSIS DIP (MANUAL ENTRY) - Abnormal; Notable for the following components:      Result Value   Clarity, UA cloudy (*)    Spec Grav, UA >=1.030 (*)    Blood, UA large (*)    Protein Ur, POC =100 (*)    Leukocytes, UA Moderate (2+) (*)    All other components within normal limits  URINE CULTURE    EKG   Radiology No results found.  Procedures Procedures (including critical care time)  Medications Ordered in UC Medications - No data to display  Initial Impression / Assessment and Plan / UC Course  I have reviewed the triage vital signs and the nursing notes.  Pertinent labs & imaging results that were available during my care of the patient were reviewed by me and considered in my medical decision making (see chart  for details).    Urine dipstick positive for UTI. Start keflex as directed. Push fluids. Return precautions given.  Final Clinical Impressions(s) / UC Diagnoses   Final diagnoses:  Cystitis   ED Prescriptions    Medication Sig Dispense Auth. Provider   cephALEXin  (KEFLEX) 500 MG capsule Take 1 capsule (500 mg total) by mouth 2 (two) times daily. 10 capsule Ok Edwards, PA-C     PDMP not reviewed this encounter.   Ok Edwards, PA-C 10/11/19 [phone]

## 2019-10-11 NOTE — ED Triage Notes (Signed)
Patient presents with complaints of urinary frequency that started x 2 days ago. She denies abdominal pain.

## 2019-10-15 LAB — URINE CULTURE: Culture: >=100000 — AB

## 2020-01-13 ENCOUNTER — Telehealth: Payer: Self-pay | Admitting: Family Medicine

## 2020-01-13 ENCOUNTER — Other Ambulatory Visit: Payer: Self-pay

## 2020-01-13 DIAGNOSIS — F40248 Other situational type phobia: Secondary | ICD-10-CM

## 2020-01-13 MED ORDER — PROPRANOLOL HCL 10 MG PO TABS: ORAL_TABLET | ORAL | 1 refills | Status: DC

## 2020-01-13 NOTE — Telephone Encounter (Signed)
Rx sent to pharmacy   

## 2020-01-13 NOTE — Telephone Encounter (Signed)
CB# [phone] Need refill Propranolol [id number] - [place], [place] - [address]

## 2020-04-19 ENCOUNTER — Other Ambulatory Visit: Payer: Self-pay

## 2020-04-19 ENCOUNTER — Ambulatory Visit (INDEPENDENT_AMBULATORY_CARE_PROVIDER_SITE_OTHER): Payer: 59 | Admitting: Family Medicine

## 2020-04-19 ENCOUNTER — Encounter: Payer: Self-pay | Admitting: Family Medicine

## 2020-04-19 VITALS — BP 114/62 | HR 64 | Temp 98.1°F | Resp 14 | Ht 66.0 in | Wt 167.0 lb

## 2020-04-19 DIAGNOSIS — E559 Vitamin D deficiency, unspecified: Secondary | ICD-10-CM

## 2020-04-19 DIAGNOSIS — Z1159 Encounter for screening for other viral diseases: Secondary | ICD-10-CM

## 2020-04-19 DIAGNOSIS — F411 Generalized anxiety disorder: Secondary | ICD-10-CM

## 2020-04-19 DIAGNOSIS — Z0000 Encounter for general adult medical examination without abnormal findings: Secondary | ICD-10-CM

## 2020-04-19 DIAGNOSIS — Z23 Encounter for immunization: Secondary | ICD-10-CM | POA: Diagnosis not present

## 2020-04-19 DIAGNOSIS — Z0001 Encounter for general adult medical examination with abnormal findings: Secondary | ICD-10-CM

## 2020-04-19 MED ORDER — BUSPIRONE HCL 7.5 MG PO TABS: 7.5000 mg | ORAL_TABLET | Freq: Two times a day (BID) | ORAL | 1 refills | Status: DC

## 2020-04-19 NOTE — Progress Notes (Signed)
   Subjective:    Patient ID: Rebecca Sexton, female    DOB: [birth year], 44 y.o.   MRN: [id number]  Patient presents for Annual Exam (is fasting)   Pt here to f/u CPE  States she is overall doing well but she has noted increase in her anxiety.  She is out of public she gets very anxious her heart starts racing.  She describes getting anxious trying to figure out the drive through at fastfood, she gets very overwhelmed and on edge out in public. Previously this was just with public speaking but getting worse.    She denies any feelings of depression but has had some grief reaction her best friend passed away from [place] in 10-12-2022 of this is been very challenging for her.  She feels like she sleeps well she does have regular activities she exercises some but not like she put should be good.   LMP last Friday, she is starting see some irregularities    Due for PAP Smear - pt to schedule with [place] [facility]   mAMMOGRAM DUE- [facility]   Dent[facility] yearly     Review Of Systems:  GEN- denies fatigue, fever, weight loss,weakness, recent illness HEENT- denies eye drainage, change in vision, nasal discharge, CVS- denies chest pain, palpitations RESP- denies SOB, cough, wheeze ABD- denies N/V, change in stools, abd pain GU- denies dysuria, hematuria, dribbling, incontinence MSK- denies joint pain, muscle aches, injury Neuro- denies headache, dizziness, syncope, seizure activity       Objective:    BP 114/62   Pulse 64   Temp 98.1 F (36.7 C) (Temporal)   Resp 14   Ht 5\' 6"  (1.676 m)   Wt 167 lb (75.8 kg)   SpO2 97%   BMI 26.95 kg/m  GEN- NAD, alert and oriented x3 HEENT- PERRL, EOMI, non injected sclera, pink conjunctiva, MMM, oropharynx clear Neck- Supple, no thyromegaly CVS- RRR, no murmur RESP-CTAB ABD-NABS,soft,NT,ND Psych n ormal affect and mood , tearful discussing her best friend ,well groomed  EXT- No edema Pulses- Radial, DP- 2+  GAD 7 score 4 PHQ  9 score  2      Assessment & Plan:      Problem List Items Addressed This Visit      Unprioritized   GAD (generalized anxiety disorder)/Social Anxiety     Trial of buspar 7.5mg  BID  Discussed SE of medication F/U in 4 weeks for telehealth visit       Relevant Medications   busPIRone (BUSPAR) 7.5 MG tablet   Vitamin D deficiency    Other Visit Diagnoses    Routine general medical examination at a health care facility    -  Primary   CPE done fasting labs, Flu shot given, schedule PAP /MAMMO with GYN    Relevant Orders   CBC with Differential/Platelet   Comprehensive metabolic panel   Lipid panel   TSH   Need for hepatitis C screening test       Relevant Orders   Hepatitis C antibody   Need for immunization against influenza       Relevant Orders   Flu Vaccine QUAD 36+ mos IM (Completed)      Note: This dictation was prepared with Dragon dictation along with smaller phrase technology. Any transcriptional errors that result from this process are unintentional.

## 2020-04-19 NOTE — Assessment & Plan Note (Signed)
Trial of buspar 7.5mg  BID  Discussed SE of medication F/U in 4 weeks for telehealth visit

## 2020-04-19 NOTE — Patient Instructions (Addendum)
F/U 1 MONTH via Telehealth Mychart visit

## 2020-04-20 LAB — COMPREHENSIVE METABOLIC PANEL
AG Ratio: 1.3 (calc) (ref 1.0–2.5)
ALT: 6 U/L (ref 6–29)
AST: 11 U/L (ref 10–35)
Albumin: 3.8 g/dL (ref 3.6–5.1)
Alkaline phosphatase (APISO): 48 U/L (ref 31–125)
BUN: 9 mg/dL (ref 7–25)
CO2: 22 mmol/L (ref 20–32)
Calcium: 8.8 mg/dL (ref 8.6–10.2)
Chloride: 106 mmol/L (ref 98–110)
Creat: 0.71 mg/dL (ref 0.50–1.10)
Globulin: 2.9 g/dL (calc) (ref 1.9–3.7)
Glucose, Bld: 83 mg/dL (ref 65–99)
Potassium: 4.2 mmol/L (ref 3.5–5.3)
Sodium: 138 mmol/L (ref 135–146)
Total Bilirubin: 0.6 mg/dL (ref 0.2–1.2)
Total Protein: 6.7 g/dL (ref 6.1–8.1)

## 2020-04-20 LAB — CBC WITH DIFFERENTIAL/PLATELET
Absolute Monocytes: 258 cells/uL (ref 200–950)
Basophils Absolute: 58 cells/uL (ref 0–200)
Basophils Relative: 1.7 %
Eosinophils Absolute: 68 cells/uL (ref 15–500)
Eosinophils Relative: 2 %
HCT: 33.4 % — ABNORMAL LOW (ref 35.0–45.0)
Hemoglobin: 10.6 g/dL — ABNORMAL LOW (ref 11.7–15.5)
Lymphs Abs: 1452 cells/uL (ref 850–3900)
MCH: 24.9 pg — ABNORMAL LOW (ref 27.0–33.0)
MCHC: 31.7 g/dL — ABNORMAL LOW (ref 32.0–36.0)
MCV: 78.4 fL — ABNORMAL LOW (ref 80.0–100.0)
MPV: 10.9 fL (ref 7.5–12.5)
Monocytes Relative: 7.6 %
Neutro Abs: 1564 cells/uL (ref 1500–7800)
Neutrophils Relative %: 46 %
Platelets: 317 10*3/uL (ref 140–400)
RBC: 4.26 10*6/uL (ref 3.80–5.10)
RDW: 15.8 % — ABNORMAL HIGH (ref 11.0–15.0)
Total Lymphocyte: 42.7 %
WBC: 3.4 10*3/uL — ABNORMAL LOW (ref 3.8–10.8)

## 2020-04-20 LAB — LIPID PANEL
Cholesterol: 143 mg/dL (ref <200)
HDL: 50 mg/dL (ref >=50)
LDL Cholesterol (Calc): 77 mg/dL (calc)
Non-HDL Cholesterol (Calc): 93 mg/dL (calc) (ref <130)
Total CHOL/HDL Ratio: 2.9 (calc) (ref <5.0)
Triglycerides: 82 mg/dL (ref <150)

## 2020-04-20 LAB — HEPATITIS C ANTIBODY
Hepatitis C Ab: NONREACTIVE
SIGNAL TO CUT-OFF: 0.01 (ref <1.00)

## 2020-04-20 LAB — TSH: TSH: 1.58 mIU/L

## 2020-04-29 ENCOUNTER — Other Ambulatory Visit: Payer: Self-pay | Admitting: *Deleted

## 2020-04-29 DIAGNOSIS — D539 Nutritional anemia, unspecified: Secondary | ICD-10-CM

## 2020-05-06 ENCOUNTER — Other Ambulatory Visit: Payer: Self-pay

## 2020-05-06 ENCOUNTER — Other Ambulatory Visit: Payer: 59

## 2020-05-06 DIAGNOSIS — D539 Nutritional anemia, unspecified: Secondary | ICD-10-CM

## 2020-05-07 ENCOUNTER — Other Ambulatory Visit: Payer: Self-pay

## 2020-05-07 DIAGNOSIS — D649 Anemia, unspecified: Secondary | ICD-10-CM

## 2020-05-07 DIAGNOSIS — D539 Nutritional anemia, unspecified: Secondary | ICD-10-CM

## 2020-05-07 LAB — IRON,TIBC AND FERRITIN PANEL
%SAT: 8 % (calc) — ABNORMAL LOW (ref 16–45)
Ferritin: 6 ng/mL — ABNORMAL LOW (ref 16–232)
Iron: 28 ug/dL — ABNORMAL LOW (ref 40–190)
TIBC: 344 mcg/dL (calc) (ref 250–450)

## 2020-07-26 ENCOUNTER — Other Ambulatory Visit: Payer: Self-pay | Admitting: Family Medicine

## 2020-07-26 DIAGNOSIS — F40248 Other situational type phobia: Secondary | ICD-10-CM

## 2020-07-27 ENCOUNTER — Other Ambulatory Visit: Payer: Self-pay | Admitting: *Deleted

## 2020-07-27 DIAGNOSIS — F40248 Other situational type phobia: Secondary | ICD-10-CM

## 2020-07-27 MED ORDER — PROPRANOLOL HCL 10 MG PO TABS: ORAL_TABLET | ORAL | 0 refills | Status: DC

## 2020-07-27 MED ORDER — BUSPIRONE HCL 7.5 MG PO TABS: 7.5000 mg | ORAL_TABLET | Freq: Two times a day (BID) | ORAL | 3 refills | Status: DC

## 2020-09-15 ENCOUNTER — Other Ambulatory Visit: Payer: Self-pay

## 2020-09-15 ENCOUNTER — Encounter: Payer: Self-pay | Admitting: Emergency Medicine

## 2020-09-15 ENCOUNTER — Ambulatory Visit
Admission: EM | Admit: 2020-09-15 | Discharge: 2020-09-15 | Disposition: A | Discharge status: Home / Self Care | Payer: 59 | Attending: Emergency Medicine | Admitting: Emergency Medicine

## 2020-09-15 DIAGNOSIS — R21 Rash and other nonspecific skin eruption: Secondary | ICD-10-CM | POA: Diagnosis not present

## 2020-09-15 MED ORDER — FLUCONAZOLE 150 MG PO TABS: 150.0000 mg | ORAL_TABLET | ORAL | 0 refills | Status: AC

## 2020-09-15 MED ORDER — NYSTATIN-TRIAMCINOLONE 100000-0.1 UNIT/GM-% EX CREA: 1.0000 "application " | TOPICAL_CREAM | Freq: Two times a day (BID) | CUTANEOUS | 0 refills | Status: DC

## 2020-09-15 NOTE — ED Provider Notes (Signed)
[facility]    CSN: [id number] Arrival date & time: 09/15/20  0801      History   Chief Complaint Chief Complaint  Patient presents with  . Rash    HPI Rebecca Sexton is a 45 y.o. female presenting today for evaluation of a rash.  Reports rash to left arm and left leg for a few months.  Reports initially noticed on her arm, but feels symptoms have spread and worsened since onset.  Using over-the-counter hydrocortisone as well as fungal cream over the past month without relief.  Denies associated itching or pain.  Denies history of similar.  Denies history of any skin problems.  HPI  Past Medical History:  Diagnosis Date  . Anxiety    Phreesia 04/19/2020  . Blood type, Rh positive   . BRCA negative 03/2016   MyRisk neg except ATM VUS  . Family history of breast cancer 03/2016   MyRisk neg  . Family history of ovarian cancer    PAT AUNT  . Genetic testing of female 03/2016   ATM VUS  . History of mammogram 06/21/2011; [id number]   BIRADS 2 BENIGN @ [place];  NEG  . History of Papanicolaou smear of cervix 03/06/2013; 03/22/2016   -/-; -/-  . Increased risk of breast cancer 03/2016   IBIS=24.4% PER [facility]  . Ovarian cyst 11/2013   RIGHT OV CYSTS  . Sickle cell trait ([place])   . Twin pregnancy delivered 05/15/2007   FEMALE/FEMALE @ 37WKS    Patient Active Problem List   Diagnosis Date Noted  . GAD (generalized anxiety disorder)/Social Anxiety  04/19/2020  . Vitamin D deficiency 01/15/2018  . Overweight (BMI 25.0-29.9) 01/15/2018  . Fear of public speaking 79/39/0300  . Increased risk of breast cancer 03/29/2016    Past Surgical History:  Procedure Laterality Date  . CESAREAN SECTION  05/15/2007   TWINS  . FOOT SURGERY  2006   LEFT    OB History    Gravida  2   Para  2   Term  2   Preterm      AB      Living  3     SAB      IAB      Ectopic      Multiple  1   Live Births  3            Home Medications    Prior to Admission  medications   Medication Sig Start Date End Date Taking? Authorizing Provider  fluconazole (DIFLUCAN) 150 MG tablet Take 1 tablet (150 mg total) by mouth once a week for 4 doses. 09/15/20 10/07/20 Yes Katrin Grabel C, PA-C  nystatin-triamcinolone (MYCOLOG II) cream Apply 1 application topically 2 (two) times daily. 09/15/20  Yes Ruble Pumphrey C, PA-C  busPIRone (BUSPAR) 7.5 MG tablet Take 1 tablet (7.5 mg total) by mouth 2 (two) times daily. 07/27/20   [name], MD  Cholecalciferol (VITAMIN D) 2000 units CAPS Take by mouth. Reported on 10/26/2015    [provider]  propranolol (INDERAL) 10 MG tablet TAKE 1 TABLET BY MOUTH 1 HOUR BEFORE REPRESENTATION, CAN REPEAT IF NEEDED 6 HOURS LATER 07/27/20   [name], MD    Family History Family History  Problem Relation Age of Onset  . Hypertension Mother   . Aneurysm Mother   . Hyperlipidemia Mother   . Atrial fibrillation Mother   . Cancer Father 64       Lung/Cancer   .  Stroke Father   . Heart disease Father        Heart attack at death   . Hypertension Sister   . Other Sister        TWIN PREG  . Breast cancer Maternal Aunt 60  . Diabetes Maternal Uncle   . Breast cancer Paternal Aunt 72  . Breast cancer Paternal Aunt 43  . Other Cousin        TWIN PREG  . Ovarian cancer Paternal Aunt 56    Social History Social History   Tobacco Use  . Smoking status: Never Smoker  . Smokeless tobacco: Never Used  Vaping Use  . Vaping Use: Never used  Substance Use Topics  . Alcohol use: No  . Drug use: No     Allergies   Patient has no known allergies.   Review of Systems Review of Systems  Constitutional: Negative for fatigue and fever.  HENT: Negative for mouth sores.   Eyes: Negative for visual disturbance.  Respiratory: Negative for shortness of breath.   Cardiovascular: Negative for chest pain.  Gastrointestinal: Negative for abdominal pain, nausea and vomiting.  Genitourinary: Negative for genital  sores.  Musculoskeletal: Negative for arthralgias and joint swelling.  Skin: Positive for color change and rash. Negative for wound.  Neurological: Negative for dizziness, weakness, light-headedness and headaches.     Physical Exam Triage Vital Signs ED Triage Vitals  Enc Vitals Group     BP      Pulse      Resp      Temp      Temp src      SpO2      Weight      Height      Head Circumference      Peak Flow      Pain Score      Pain Loc      Pain Edu?      Excl. in [place]?    No data found.  Updated Vital Signs BP (!) 130/92 (BP Location: Right Arm)   Pulse 85   Temp 98.5 F (36.9 C) (Oral)   Resp 18   SpO2 100%   Visual Acuity Right Eye Distance:   Left Eye Distance:   Bilateral Distance:    Right Eye Near:   Left Eye Near:    Bilateral Near:     Physical Exam Vitals and nursing note reviewed.  Constitutional:      Appearance: She is well-developed.     Comments: No acute distress  HENT:     Head: Normocephalic and atraumatic.     Nose: Nose normal.  Eyes:     Conjunctiva/sclera: Conjunctivae normal.  Cardiovascular:     Rate and Rhythm: Normal rate.  Pulmonary:     Effort: Pulmonary effort is normal. No respiratory distress.  Abdominal:     General: There is no distension.  Musculoskeletal:        General: Normal range of motion.     Cervical back: Neck supple.  Skin:    General: Skin is warm and dry.     Comments: Left upper arm on medial aspect with circular area of erythema, faint central clearing, smaller satellite lesion noted slightly more distal as well as similar area noted to chest near axillary area  Left posterior proximal thigh with similar circular slightly erythematous area without induration or warmth, no fluctuance  Neurological:     Mental Status: She is alert and oriented to person, place,  and time.      UC Treatments / Results  Labs (all labs ordered are listed, but only abnormal results are displayed) Labs Reviewed - No  data to display  EKG   Radiology No results found.  Procedures Procedures (including critical care time)  Medications Ordered in UC Medications - No data to display  Initial Impression / Assessment and Plan / UC Course  I have reviewed the triage vital signs and the nursing notes.  Pertinent labs & imaging results that were available during my care of the patient were reviewed by me and considered in my medical decision making (see chart for details).     Rashes most suspicious of fungal etiology given persistent nature of this, and appearance with slight central clearing, seems less likely inflammatory/eczematous, does not appear bacterial/infectious.  Providing Diflucan orally as she has likely been applying clotrimazole topically without relief as well as providing triamcinolone/nystatin cream topically to use as alternative as well.  Discussed following up with dermatology if persisting.  Discussed strict return precautions. Patient verbalized understanding and is agreeable with plan.   Final Clinical Impressions(s) / UC Diagnoses   Final diagnoses:  Rash and nonspecific skin eruption     Discharge Instructions     Take Diflucan once weekly for the next 2-4 weeks Use combination fungal/steroid cream twice daily to areas Follow-up if still not improving or worsening    ED Prescriptions    Medication Sig Dispense Auth. Provider   nystatin-triamcinolone (MYCOLOG II) cream Apply 1 application topically 2 (two) times daily. 60 g Mariam Helbert C, PA-C   fluconazole (DIFLUCAN) 150 MG tablet Take 1 tablet (150 mg total) by mouth once a week for 4 doses. 4 tablet Elya Tarquinio, [place] C, PA-C     PDMP not reviewed this encounter.   Janith [place], [place] 09/15/20 [phone]

## 2020-09-15 NOTE — Discharge Instructions (Signed)
Take Diflucan once weekly for the next 2-4 weeks Use combination fungal/steroid cream twice daily to areas Follow-up if still not improving or worsening

## 2020-09-15 NOTE — ED Triage Notes (Signed)
Pt here for rash to left arm x 2 months; denies itching; pt sts some on left leg also

## 2020-09-27 NOTE — Progress Notes (Signed)
PCP:  [name], MD   Chief Complaint  Patient presents with  . Gynecologic Exam  . Pelvic Pain    Right side only x 1 month     HPI:      [name] is a 45 y.o. G2P2003 who LMP was Patient's last menstrual period was 09/20/2020 (approximate)., presents today for her annual examination.  Her menses are regular every 28-30 days, lasting 3-5 days, mod flow. Some months with few days dark spotting only.  Dysmenorrhea mild, occurring first 1-2 days of flow. She does not have intermenstrual bleeding. Stopped OCPs a couple yrs ago and menses are normal.   Has been having RLQ pain intermittently for the past few months. Pain is achy, sharp to dull, and lasts a few days to a wk. Slightly improved with NSAIDs, but not relieved. Doesn't know of aggravating factors. Hx of ovar cyst in past. No GI sx with pain. No dyspareunia.  Sex activity: single partner, contraception -vasectomy.  Last Pap: March 22, 2016  Results were: no abnormalities /neg HPV DNA  Hx of STDs: none  Last mammogram:07/12/17 Results: no abnormalities, repeat in 12 months There is a FH of breast cancer in her mat aunt and 2 pat aunts. There is a FH of ovarian cancer in a 3rd pat aunt. Pt is MyRisk neg except ATM VUS. The patient does do self-breast exams. IBIS=24%. Taking Vit D supp. Has not had scr breast MRI as discussed previously.  Tobacco use: The patient denies current or previous tobacco use. Alcohol use: none No drug use.  Exercise: moderately active  Colonoscopy: never  She does not get adequate calcium but does get Vitamin D in her diet.  Labs with PCP. Hx of IDA 11/21. Taking Fe supp daily. Needs lab f/u, provider no longer at their practice. Had normal lipids 11/21.   Taking buspar 7.5 mg BID for anxiety with improvement. Would like RF, PCP no longer at the practice.  Past Medical History:  Diagnosis Date  . Anxiety    Phreesia 04/19/2020  . Blood type, Rh positive   . BRCA negative  03/2016   MyRisk neg except ATM VUS  . Family history of breast cancer 03/2016   MyRisk neg  . Family history of ovarian cancer    PAT AUNT  . Genetic testing of female 03/2016   ATM VUS  . History of mammogram 06/21/2011; [id number]   BIRADS 2 BENIGN @ [place];  NEG  . History of Papanicolaou smear of cervix 03/06/2013; 03/22/2016   -/-; -/-  . Increased risk of breast cancer 03/2016   IBIS=24.4% PER [facility]  . Ovarian cyst 11/2013   RIGHT OV CYSTS  . Sickle cell trait ([place])   . Twin pregnancy delivered 05/15/2007   FEMALE/FEMALE @ 37WKS    Past Surgical History:  Procedure Laterality Date  . CESAREAN SECTION  05/15/2007   TWINS  . FOOT SURGERY  2006   LEFT    Family History  Problem Relation Age of Onset  . Hypertension Mother   . Aneurysm Mother   . Hyperlipidemia Mother   . Atrial fibrillation Mother   . Cancer Father 53       Lung/Cancer   . Stroke Father   . Heart disease Father        Heart attack at death   . Hypertension Sister   . Other Sister        TWIN PREG  . Breast cancer Maternal Aunt 60  .  Diabetes Maternal Uncle   . Breast cancer Paternal Aunt 74  . Breast cancer Paternal Aunt 53  . Other Cousin        TWIN PREG  . Ovarian cancer Paternal Aunt 63    Social History   Socioeconomic History  . Marital status: Married    Spouse name: Not on file  . Number of children: 3  . Years of education: 47  . Highest education level: Not on file  Occupational History  . Occupation: INSURANCE AGENT  Tobacco Use  . Smoking status: Never Smoker  . Smokeless tobacco: Never Used  Vaping Use  . Vaping Use: Never used  Substance and Sexual Activity  . Alcohol use: No  . Drug use: No  . Sexual activity: Yes    Birth control/protection: None, Surgical    Comment: Vasectomy  Other Topics Concern  . Not on file  Social History Narrative  . Not on file   Social Determinants of Health   Financial Resource Strain: Not on file  Food Insecurity: Not on file   Transportation Needs: Not on file  Physical Activity: Not on file  Stress: Not on file  Social Connections: Not on file  Intimate Partner Violence: Not on file    Current Meds  Medication Sig  . Cholecalciferol (VITAMIN D) 2000 units CAPS Take by mouth. Reported on 10/26/2015  . fluconazole (DIFLUCAN) 150 MG tablet Take 1 tablet (150 mg total) by mouth once a week for 4 doses.  Marland Kitchen nystatin-triamcinolone (MYCOLOG II) cream Apply 1 application topically 2 (two) times daily.  . propranolol (INDERAL) 10 MG tablet TAKE 1 TABLET BY MOUTH 1 HOUR BEFORE REPRESENTATION, CAN REPEAT IF NEEDED 6 HOURS LATER  . [DISCONTINUED] busPIRone (BUSPAR) 7.5 MG tablet Take 1 tablet (7.5 mg total) by mouth 2 (two) times daily.     ROS:  Review of Systems  Constitutional: Positive for fatigue. Negative for fever and unexpected weight change.  Respiratory: Negative for cough, shortness of breath and wheezing.   Cardiovascular: Negative for chest pain, palpitations and leg swelling.  Gastrointestinal: Negative for blood in stool, constipation, diarrhea, nausea and vomiting.  Endocrine: Negative for cold intolerance, heat intolerance and polyuria.  Genitourinary: Negative for dyspareunia, dysuria, flank pain, frequency, genital sores, hematuria, menstrual problem, pelvic pain, urgency, vaginal bleeding, vaginal discharge and vaginal pain.  Musculoskeletal: Negative for back pain, joint swelling and myalgias.  Skin: Negative for rash.  Neurological: Negative for dizziness, syncope, light-headedness, numbness and headaches.  Hematological: Negative for adenopathy.  Psychiatric/Behavioral: Positive for agitation. Negative for confusion, sleep disturbance and suicidal ideas. The patient is not nervous/anxious.      Objective: BP 100/70   Ht 5' 6" (1.676 m)   Wt 170 lb (77.1 kg)   LMP 09/20/2020 (Approximate)   BMI 27.44 kg/m    Physical Exam Constitutional:      Appearance: She is well-developed.   Genitourinary:     Vulva normal.     Right Labia: No rash, tenderness or lesions.    Left Labia: No tenderness, lesions or rash.    No vaginal discharge, erythema or tenderness.      Right Adnexa: not tender and no mass present.    Left Adnexa: not tender and no mass present.    No cervical motion tenderness, friability or polyp.     Uterus is not enlarged or tender.  Breasts:     Right: No mass, nipple discharge, skin change or tenderness.     Left: No  mass, nipple discharge, skin change or tenderness.    Neck:     Thyroid: No thyromegaly.  Cardiovascular:     Rate and Rhythm: Normal rate and regular rhythm.     Heart sounds: Normal heart sounds. No murmur heard.   Pulmonary:     Effort: Pulmonary effort is normal.     Breath sounds: Normal breath sounds.  Abdominal:     Palpations: Abdomen is soft.     Tenderness: There is no abdominal tenderness. There is no guarding or rebound.  Musculoskeletal:        General: Normal range of motion.     Cervical back: Normal range of motion.  Lymphadenopathy:     Cervical: No cervical adenopathy.  Neurological:     General: No focal deficit present.     Mental Status: She is alert and oriented to person, place, and time.     Cranial Nerves: No cranial nerve deficit.  Skin:    General: Skin is warm and dry.  Psychiatric:        Mood and Affect: Mood normal.        Behavior: Behavior normal.        Thought Content: Thought content normal.        Judgment: Judgment normal.  Vitals reviewed.    Assessment/Plan: Encounter for annual routine gynecological examination  Cervical cancer screening - Plan: Cytology - PAP  Screening for HPV (human papillomavirus) - Plan: Cytology - PAP  Encounter for screening mammogram for malignant neoplasm of breast - Plan: MM 3D SCREEN BREAST BILATERAL; pt to sched mammo  Family history of breast cancer--Pt is MyRisk neg  Increased risk of breast cancer--pt aware of monthly SBE, yearly CBE  and mammos, as well as scr breast MRI. Pt to sched mammo and f/u if desires MRI ref. Cont Vit D supp  Screening for colon cancer--colonoscopy/cologuard discussed. Pt wants to consider and will f/u with decision.  Iron deficiency anemia due to chronic blood loss - Plan: CBC with Differential/Platelet; with PCP 12/21. Taking Fe supp daily. Due for recheck.   RLQ abdominal pain - Plan: US PELVIC COMPLETE WITH TRANSVAGINAL; neg exam. Check GYN u/s, will f/u with results. Keep diary in meantime, NSAIDs.   Anxiety - Plan: busPIRone (BUSPAR) 7.5 MG tablet; Rx RF. F/u prn.   Meds ordered this encounter  Medications  . busPIRone (BUSPAR) 7.5 MG tablet    Sig: Take 1 tablet (7.5 mg total) by mouth 2 (two) times daily.    Dispense:  180 tablet    Refill:  3    Order Specific Question:   Supervising Provider    Answer:   Gae Dry [[id number]]          GYN counsel breast self exam, mammography screening, adequate intake of calcium and vitamin D, diet and exercise     F/U  Return in about 1 year (around 09/28/2021).  Miakoda Mcmillion B. Mariadejesus Cade, PA-C 09/28/2020 10:05 AM

## 2020-09-28 ENCOUNTER — Encounter: Payer: Self-pay | Admitting: Obstetrics and Gynecology

## 2020-09-28 ENCOUNTER — Ambulatory Visit (INDEPENDENT_AMBULATORY_CARE_PROVIDER_SITE_OTHER): Payer: 59 | Admitting: Obstetrics and Gynecology

## 2020-09-28 ENCOUNTER — Other Ambulatory Visit: Payer: Self-pay

## 2020-09-28 ENCOUNTER — Other Ambulatory Visit (HOSPITAL_COMMUNITY)
Admission: RE | Admit: 2020-09-28 | Discharge: 2020-09-28 | Disposition: A | Discharge status: Home / Self Care | Payer: 59 | Source: Ambulatory Visit | Attending: Obstetrics and Gynecology | Admitting: Obstetrics and Gynecology

## 2020-09-28 VITALS — BP 100/70 | Ht 66.0 in | Wt 170.0 lb

## 2020-09-28 DIAGNOSIS — D500 Iron deficiency anemia secondary to blood loss (chronic): Secondary | ICD-10-CM

## 2020-09-28 DIAGNOSIS — F419 Anxiety disorder, unspecified: Secondary | ICD-10-CM

## 2020-09-28 DIAGNOSIS — Z124 Encounter for screening for malignant neoplasm of cervix: Secondary | ICD-10-CM | POA: Insufficient documentation

## 2020-09-28 DIAGNOSIS — Z1231 Encounter for screening mammogram for malignant neoplasm of breast: Secondary | ICD-10-CM

## 2020-09-28 DIAGNOSIS — Z1211 Encounter for screening for malignant neoplasm of colon: Secondary | ICD-10-CM

## 2020-09-28 DIAGNOSIS — Z1151 Encounter for screening for human papillomavirus (HPV): Secondary | ICD-10-CM

## 2020-09-28 DIAGNOSIS — Z803 Family history of malignant neoplasm of breast: Secondary | ICD-10-CM

## 2020-09-28 DIAGNOSIS — Z01419 Encounter for gynecological examination (general) (routine) without abnormal findings: Secondary | ICD-10-CM

## 2020-09-28 DIAGNOSIS — R1031 Right lower quadrant pain: Secondary | ICD-10-CM

## 2020-09-28 DIAGNOSIS — Z9189 Other specified personal risk factors, not elsewhere classified: Secondary | ICD-10-CM

## 2020-09-28 MED ORDER — BUSPIRONE HCL 7.5 MG PO TABS
7.5000 mg | ORAL_TABLET | Freq: Two times a day (BID) | ORAL | 3 refills | Status: DC
Start: 2020-09-28 — End: 2021-03-02

## 2020-09-28 NOTE — Patient Instructions (Addendum)
I value your feedback and you entrusting us with your care. If you get a [facility] patient survey, I would appreciate you taking the time to let us know about your experience today. Thank you!  [facility]: [phone]      

## 2020-09-29 LAB — CBC WITH DIFFERENTIAL/PLATELET
Basophils Absolute: 0.1 10*3/uL (ref 0.0–0.2)
Basos: 2 %
EOS (ABSOLUTE): 0.1 10*3/uL (ref 0.0–0.4)
Eos: 2 %
Hematocrit: 35.4 % (ref 34.0–46.6)
Hemoglobin: 11.5 g/dL (ref 11.1–15.9)
Immature Grans (Abs): 0 10*3/uL (ref 0.0–0.1)
Immature Granulocytes: 0 %
Lymphocytes Absolute: 1.6 10*3/uL (ref 0.7–3.1)
Lymphs: 46 %
MCH: 25.6 pg — ABNORMAL LOW (ref 26.6–33.0)
MCHC: 32.5 g/dL (ref 31.5–35.7)
MCV: 79 fL (ref 79–97)
Monocytes Absolute: 0.3 10*3/uL (ref 0.1–0.9)
Monocytes: 7 %
Neutrophils Absolute: 1.5 10*3/uL (ref 1.4–7.0)
Neutrophils: 43 %
Platelets: 363 10*3/uL (ref 150–450)
RBC: 4.49 x10E6/uL (ref 3.77–5.28)
RDW: 16.3 % — ABNORMAL HIGH (ref 11.7–15.4)
WBC: 3.4 10*3/uL (ref 3.4–10.8)

## 2020-09-29 LAB — CYTOLOGY - PAP
Comment: NEGATIVE
Diagnosis: NEGATIVE
High risk HPV: NEGATIVE

## 2020-11-01 ENCOUNTER — Telehealth: Payer: Self-pay

## 2020-11-01 ENCOUNTER — Other Ambulatory Visit: Payer: Self-pay | Admitting: Obstetrics and Gynecology

## 2020-11-01 MED ORDER — SCOPOLAMINE 1 MG/3DAYS TD PT72
1.0000 | MEDICATED_PATCH | TRANSDERMAL | 0 refills | Status: DC
Start: 2020-11-01 — End: 2021-02-15

## 2020-11-01 NOTE — Telephone Encounter (Signed)
Pt calling; in the past [facility] has written rx for motion sickness patches; is going on a cruise next week; can she have a rx?  [phone]  Courtesy call to pt that [facility] is not in today but is in tomorrow.  Will forward this msg to her.

## 2020-11-01 NOTE — Progress Notes (Signed)
Rx RF scopolamine patch for cruise

## 2020-11-01 NOTE — Telephone Encounter (Signed)
I sent in RF for pt. Pls notify. Thx.

## 2020-11-02 NOTE — Telephone Encounter (Signed)
Tried again, no answer, mail box full could not leave voice msg

## 2020-11-02 NOTE — Telephone Encounter (Signed)
Called pt, no answer, mail box full could not leave voice msg.

## 2020-11-12 ENCOUNTER — Ambulatory Visit: Payer: 59

## 2020-12-05 ENCOUNTER — Other Ambulatory Visit: Payer: Self-pay

## 2020-12-05 ENCOUNTER — Ambulatory Visit
Admission: EM | Admit: 2020-12-05 | Discharge: 2020-12-05 | Disposition: A | Discharge status: Home / Self Care | Payer: 59 | Attending: Student | Admitting: Student

## 2020-12-05 ENCOUNTER — Encounter: Payer: Self-pay | Admitting: Emergency Medicine

## 2020-12-05 DIAGNOSIS — R21 Rash and other nonspecific skin eruption: Secondary | ICD-10-CM

## 2020-12-05 MED ORDER — TRIAMCINOLONE ACETONIDE 0.1 % EX CREA: 1.0000 "application " | TOPICAL_CREAM | Freq: Two times a day (BID) | CUTANEOUS | 0 refills | Status: DC

## 2020-12-05 NOTE — ED Provider Notes (Signed)
[facility]    CSN: [id number] Arrival date & time: 12/05/20  0808      History   Chief Complaint Chief Complaint  Patient presents with   Rash    HPI Rebecca Sexton is a 45 y.o. female presenting with a rash to the bikini line, torso, legs for about 1 week following going to the beach.  Medical history noncontributory.  States she went to the beach about 1 week ago, following this she developed an itchy rash to the bikini line. She did shave, and also came into contact with seaweed at the beach.  It has since spread to her abdomen, back, and lower extremities.  Describes it as very itchy.  Has tried Benadryl cream with minimal improvement.  Feeling well otherwise, denies shortness of breath, cough, wheezing, fevers/chills.  HPI  Past Medical History:  Diagnosis Date   Anxiety    Phreesia 04/19/2020   Blood type, Rh positive    BRCA negative 03/2016   MyRisk neg except ATM VUS   Family history of breast cancer 03/2016   MyRisk neg   Family history of ovarian cancer    PAT AUNT   Genetic testing of female 03/2016   ATM VUS   History of mammogram 06/21/2011; [id number]   BIRADS 2 BENIGN @ [facility];  NEG   History of Papanicolaou smear of cervix 03/06/2013; 03/22/2016   -/-; -/-   Increased risk of breast cancer 03/2016   IBIS=24.4% PER [facility]   Ovarian cyst 11/2013   RIGHT OV CYSTS   Sickle cell trait ([place])    Twin pregnancy delivered 05/15/2007   FEMALE/FEMALE @ 37WKS    Patient Active Problem List   Diagnosis Date Noted   GAD (generalized anxiety disorder)/Social Anxiety  04/19/2020   Vitamin D deficiency 01/15/2018   Overweight (BMI 25.0-29.9) 86/76/1950   Fear of public speaking 93/26/7124   Increased risk of breast cancer 03/29/2016    Past Surgical History:  Procedure Laterality Date   CESAREAN SECTION  05/15/2007   TWINS   FOOT SURGERY  2006   LEFT    OB History     Gravida  2   Para  2   Term  2   Preterm      AB      Living  3       SAB      IAB      Ectopic      Multiple  1   Live Births  3            Home Medications    Prior to Admission medications   Medication Sig Start Date End Date Taking? Authorizing Provider  triamcinolone cream (KENALOG) 0.1 % Apply 1 application topically 2 (two) times daily for 7 days. 12/05/20 12/12/20 Yes Hazel Sams, PA-C  busPIRone (BUSPAR) 7.5 MG tablet Take 1 tablet (7.5 mg total) by mouth 2 (two) times daily. 10/04/07   Copland, Deirdre Evener, PA-C  Cholecalciferol (VITAMIN D) 2000 units CAPS Take by mouth. Reported on 10/26/2015    [provider]  propranolol (INDERAL) 10 MG tablet TAKE 1 TABLET BY MOUTH 1 HOUR BEFORE REPRESENTATION, CAN REPEAT IF NEEDED 6 HOURS LATER 07/27/20   [name], MD  scopolamine (TRANSDERM-SCOP) 1 MG/3DAYS Place 1 patch (1.5 mg total) onto the skin every 3 (three) days. 02/04/32   Copland, Deirdre Evener, PA-C    Family History Family History  Problem Relation Age of Onset   Hypertension Mother  Aneurysm Mother    Hyperlipidemia Mother    Atrial fibrillation Mother    Cancer Father 26       Lung/Cancer    Stroke Father    Heart disease Father        Heart attack at death    Hypertension Sister    Other Sister        TWIN PREG   Breast cancer Maternal Aunt 60   Diabetes Maternal Uncle    Breast cancer Paternal Aunt 81   Breast cancer Paternal Aunt 46   Other Cousin        TWIN PREG   Ovarian cancer Paternal Aunt 49    Social History Social History   Tobacco Use   Smoking status: Never   Smokeless tobacco: Never  Vaping Use   Vaping Use: Never used  Substance Use Topics   Alcohol use: No   Drug use: No     Allergies   Patient has no known allergies.   Review of Systems Review of Systems  Skin:  Positive for rash.  All other systems reviewed and are negative.   Physical Exam Triage Vital Signs ED Triage Vitals  Enc Vitals Group     BP 12/05/20 0823 (!) 145/98     Pulse Rate 12/05/20 0823 83      Resp 12/05/20 0823 18     Temp 12/05/20 0823 98.4 F (36.9 C)     Temp Source 12/05/20 0823 Oral     SpO2 12/05/20 0823 98 %     Weight --      Height --      Head Circumference --      Peak Flow --      Pain Score 12/05/20 0824 0     Pain Loc --      Pain Edu? --      Excl. in [place]? --    No data found.  Updated Vital Signs BP (!) 145/98 (BP Location: Left Arm)   Pulse 83   Temp 98.4 F (36.9 C) (Oral)   Resp 18   SpO2 98%   Visual Acuity Right Eye Distance:   Left Eye Distance:   Bilateral Distance:    Right Eye Near:   Left Eye Near:    Bilateral Near:     Physical Exam Vitals reviewed.  Constitutional:      Appearance: Normal appearance.  HENT:     Head: Normocephalic and atraumatic.  Pulmonary:     Effort: Pulmonary effort is normal.  Skin:    Findings: Rash present.     Comments: Abdomen, back, bilateral LEs, and bikini line with diffuse skin colored fine papular rash. With few excoriations. No erythema or warmth. No facial, lip, tongue, pharyngeal swelling.  Neurological:     General: No focal deficit present.     Mental Status: She is alert and oriented to person, place, and time.  Psychiatric:        Mood and Affect: Mood normal.        Behavior: Behavior normal.        Thought Content: Thought content normal.        Judgment: Judgment normal.      UC Treatments / Results  Labs (all labs ordered are listed, but only abnormal results are displayed) Labs Reviewed - No data to display  EKG   Radiology No results found.  Procedures Procedures (including critical care time)  Medications Ordered in UC Medications - No data to  display  Initial Impression / Assessment and Plan / UC Course  I have reviewed the triage vital signs and the nursing notes.  Pertinent labs & imaging results that were available during my care of the patient were reviewed by me and considered in my medical decision making (see chart for details).     This patient  is a very pleasant 45 y.o. year old female presenting with contact dermatitis. Afebrile, nontachy. Triamcinolone as below, benedryl PO for symptomatic relief. ED return precautions discussed. Patient verbalizes understanding and agreement.     Final Clinical Impressions(s) / UC Diagnoses   Final diagnoses:  Rash and nonspecific skin eruption     Discharge Instructions      -Triamcinolone ointment twice daily for about 7 days.  Avoid the face and the genitals. -You can also use Benadryl (pills) for symptomatic relief, be careful because this can make you drowsy.     ED Prescriptions     Medication Sig Dispense Auth. Provider   triamcinolone cream (KENALOG) 0.1 % Apply 1 application topically 2 (two) times daily for 7 days. 30 g Hazel Sams, PA-C      PDMP not reviewed this encounter.   Hazel Sams, PA-C 12/05/20 [phone]

## 2020-12-05 NOTE — Discharge Instructions (Addendum)
-  Triamcinolone ointment twice daily for about 7 days.  Avoid the face and the genitals. -You can also use Benadryl (pills) for symptomatic relief, be careful because this can make you drowsy.

## 2020-12-05 NOTE — ED Triage Notes (Signed)
Pt here for rash to area where swim suit is that is itchy; pt sts recently returned from trip

## 2020-12-08 ENCOUNTER — Ambulatory Visit
Admission: EM | Admit: 2020-12-08 | Discharge: 2020-12-08 | Disposition: A | Discharge status: Home / Self Care | Payer: 59 | Attending: Urgent Care | Admitting: Urgent Care

## 2020-12-08 DIAGNOSIS — L299 Pruritus, unspecified: Secondary | ICD-10-CM

## 2020-12-08 DIAGNOSIS — R21 Rash and other nonspecific skin eruption: Secondary | ICD-10-CM

## 2020-12-08 MED ORDER — HYDROXYZINE HCL 25 MG PO TABS: 12.5000 mg | ORAL_TABLET | Freq: Three times a day (TID) | ORAL | 0 refills | Status: DC | PRN

## 2020-12-08 MED ORDER — PREDNISONE 20 MG PO TABS: ORAL_TABLET | ORAL | 0 refills | Status: DC

## 2020-12-08 MED ORDER — TRIAMCINOLONE ACETONIDE 0.1 % EX CREA: 1.0000 "application " | TOPICAL_CREAM | Freq: Two times a day (BID) | CUTANEOUS | 0 refills | Status: AC

## 2020-12-08 NOTE — ED Triage Notes (Signed)
Pt was seen here on Sunday for a itchy rash. States rash has now spread all over.

## 2020-12-08 NOTE — ED Provider Notes (Signed)
[place]   MRN: [id number] DOB: Sep 28, 1975  Subjective:   Rebecca Sexton is a 45 y.o. female presenting for recheck on persistent rash over the past 10 days.  She was last seen on 12/05/2020.  Rash started after she went to the beach.  She was prescribed triamcinolone.  Today, she reports the rash is spreading across her arms and thighs.  It is very itchy.  No pain, drainage of pus or bleeding.  Reports that she actually ran out of the triamcinolone cream because of the extensive area of her rash. Denies eating any new foods, starting new medications, exposure to poisonous plants, new hygiene products, new cleaning products or detergents.  However, the only new exposure that she can think of is that there was plenty of seaweed where she was swimming.  No current facility-administered medications for this encounter.  Current Outpatient Medications:    busPIRone (BUSPAR) 7.5 MG tablet, Take 1 tablet (7.5 mg total) by mouth 2 (two) times daily., Disp: 180 tablet, Rfl: 3   Cholecalciferol (VITAMIN D) 2000 units CAPS, Take by mouth. Reported on 10/26/2015, Disp: , Rfl:    propranolol (INDERAL) 10 MG tablet, TAKE 1 TABLET BY MOUTH 1 HOUR BEFORE REPRESENTATION, CAN REPEAT IF NEEDED 6 HOURS LATER, Disp: 30 tablet, Rfl: 0   scopolamine (TRANSDERM-SCOP) 1 MG/3DAYS, Place 1 patch (1.5 mg total) onto the skin every 3 (three) days., Disp: 3 patch, Rfl: 0   triamcinolone cream (KENALOG) 0.1 %, Apply 1 application topically 2 (two) times daily for 7 days., Disp: 30 g, Rfl: 0   No Known Allergies  Past Medical History:  Diagnosis Date   Anxiety    Phreesia 04/19/2020   Blood type, Rh positive    BRCA negative 03/2016   MyRisk neg except ATM VUS   Family history of breast cancer 03/2016   MyRisk neg   Family history of ovarian cancer    PAT AUNT   Genetic testing of female 03/2016   ATM VUS   History of mammogram 06/21/2011; [id number]   BIRADS 2 BENIGN @ [place] [facility];  NEG   History of  Papanicolaou smear of cervix 03/06/2013; 03/22/2016   -/-; -/-   Increased risk of breast cancer 03/2016   IBIS=24.4% PER [facility]   Ovarian cyst 11/2013   RIGHT OV CYSTS   Sickle cell trait ([place])    Twin pregnancy delivered 05/15/2007   FEMALE/FEMALE @ 37WKS     Past Surgical History:  Procedure Laterality Date   CESAREAN SECTION  05/15/2007   TWINS   FOOT SURGERY  2006   LEFT    Family History  Problem Relation Age of Onset   Hypertension Mother    Aneurysm Mother    Hyperlipidemia Mother    Atrial fibrillation Mother    Cancer Father 13       Lung/Cancer    Stroke Father    Heart disease Father        Heart attack at death    Hypertension Sister    Other Sister        TWIN PREG   Breast cancer Maternal Aunt 60   Diabetes Maternal Uncle    Breast cancer Paternal Aunt 24   Breast cancer Paternal Aunt 51   Other Cousin        TWIN PREG   Ovarian cancer Paternal Aunt 20    Social History   Tobacco Use   Smoking status: Never   Smokeless tobacco: Never  Vaping Use  Vaping Use: Never used  Substance Use Topics   Alcohol use: No   Drug use: No    ROS   Objective:   Vitals: BP (!) 141/93 (BP Location: Left Arm)   Pulse 79   Temp 98.2 F (36.8 C) (Oral)   Resp 18   SpO2 98%   Physical Exam Constitutional:      General: She is not in acute distress.    Appearance: Normal appearance. She is well-developed. She is not ill-appearing, toxic-appearing or diaphoretic.  HENT:     Head: Normocephalic and atraumatic.     Nose: Nose normal.     Mouth/Throat:     Mouth: Mucous membranes are moist.     Pharynx: Oropharynx is clear.  Eyes:     General: No scleral icterus.    Extraocular Movements: Extraocular movements intact.     Pupils: Pupils are equal, round, and reactive to light.  Cardiovascular:     Rate and Rhythm: Normal rate.  Pulmonary:     Effort: Pulmonary effort is normal.  Skin:    General: Skin is warm and dry.     Findings: Rash (large  patches of urticarial type lesions diffusely spread over the medial aspect of her upper and lower extremities, to lesser extent on the left flank/thoracic region of her torso) present.  Neurological:     General: No focal deficit present.     Mental Status: She is alert and oriented to person, place, and time.  Psychiatric:        Mood and Affect: Mood normal.        Behavior: Behavior normal.    Assessment and Plan :   PDMP not reviewed this encounter.  1. Rash and nonspecific skin eruption   2. Itching    Do not suspect scabies, bedbugs.  Suspect a rash that is contact dermatitis, irritant dermatitis.  Emphasized need to evaluate her exposures.  We will have her start an oral prednisone course, hydroxyzine for itching.  She does have an appointment with a dermatologist which I encouraged her to pursue. Counseled patient on potential for adverse effects with medications prescribed/recommended today, ER and return-to-clinic precautions discussed, patient verbalized understanding.    Jaynee Eagles, PA-C 12/08/20 1037

## 2021-02-15 ENCOUNTER — Ambulatory Visit (INDEPENDENT_AMBULATORY_CARE_PROVIDER_SITE_OTHER): Payer: 59 | Admitting: Obstetrics and Gynecology

## 2021-02-15 ENCOUNTER — Encounter: Payer: Self-pay | Admitting: Obstetrics and Gynecology

## 2021-02-15 ENCOUNTER — Other Ambulatory Visit: Payer: Self-pay

## 2021-02-15 VITALS — BP 110/80 | Ht 66.0 in | Wt 172.0 lb

## 2021-02-15 DIAGNOSIS — G8929 Other chronic pain: Secondary | ICD-10-CM

## 2021-02-15 DIAGNOSIS — N898 Other specified noninflammatory disorders of vagina: Secondary | ICD-10-CM | POA: Diagnosis not present

## 2021-02-15 DIAGNOSIS — R1031 Right lower quadrant pain: Secondary | ICD-10-CM

## 2021-02-15 NOTE — Progress Notes (Signed)
Rebecca Rossetti, MD   Chief Complaint  Patient presents with   Pelvic Pain    Right side only shooting to back. Lump in vag area, tender    HPI:      [name] is a 45 y.o. G2P2003 whose LMP was Patient's last menstrual period was 01/20/2021 (approximate)., presents today for RLQ pain daily for several wks. Pain is intermittent and stabbing, worse with movement, getting up or changing position/bending over. Has tried ibup without sx relief. No GI, urin, vag sx. Hx of ovar cysts a few yrs ago and sx feel similar.  She is sex active, no pain/bleeding; husband with vasectomy. Menses monthly, lasting 5 days. Also with occas vaginal lesions that develop occas, usually last a few days and then resolve. Has had 2 in past month, however, unusual for pt. No d/c, pain. Sx in hair line, shaves.   Past Medical History:  Diagnosis Date   Anxiety    Phreesia 04/19/2020   Blood type, Rh positive    BRCA negative 03/2016   MyRisk neg except ATM VUS   Family history of breast cancer 03/2016   MyRisk neg   Family history of ovarian cancer    PAT AUNT   Genetic testing of female 03/2016   ATM VUS   History of mammogram 06/21/2011; [id number]   BIRADS 2 BENIGN @ [place];  NEG   History of Papanicolaou smear of cervix 03/06/2013; 03/22/2016   -/-; -/-   Increased risk of breast cancer 03/2016   IBIS=24.4% PER [facility]   Ovarian cyst 11/2013   RIGHT OV CYSTS   Sickle cell trait ([place])    Twin pregnancy delivered 05/15/2007   FEMALE/FEMALE @ 37WKS    Past Surgical History:  Procedure Laterality Date   CESAREAN SECTION  05/15/2007   TWINS   FOOT SURGERY  2006   LEFT    Family History  Problem Relation Age of Onset   Hypertension Mother    Aneurysm Mother    Hyperlipidemia Mother    Atrial fibrillation Mother    Cancer Father 17       Lung/Cancer    Stroke Father    Heart disease Father        Heart attack at death    Hypertension Sister    Other Sister        TWIN PREG    Breast cancer Maternal Aunt 60   Diabetes Maternal Uncle    Breast cancer Paternal Aunt 2   Breast cancer Paternal Aunt 33   Other Cousin        TWIN PREG   Ovarian cancer Paternal Aunt 31    Social History   Socioeconomic History   Marital status: Married    Spouse name: Not on file   Number of children: 3   Years of education: 14   Highest education level: Not on file  Occupational History   Occupation: INSURANCE AGENT  Tobacco Use   Smoking status: Never   Smokeless tobacco: Never  Vaping Use   Vaping Use: Never used  Substance and Sexual Activity   Alcohol use: No   Drug use: No   Sexual activity: Yes    Birth control/protection: None, Surgical    Comment: Vasectomy  Other Topics Concern   Not on file  Social History Narrative   Not on file   Social Determinants of Health   Financial Resource Strain: Not on file  Food Insecurity: Not on file  Transportation  Needs: Not on file  Physical Activity: Not on file  Stress: Not on file  Social Connections: Not on file  Intimate Partner Violence: Not on file    Outpatient Medications Prior to Visit  Medication Sig Dispense Refill   busPIRone (BUSPAR) 7.5 MG tablet Take 1 tablet (7.5 mg total) by mouth 2 (two) times daily. 180 tablet 3   Cholecalciferol (VITAMIN D) 2000 units CAPS Take by mouth. Reported on 10/26/2015     hydrOXYzine (ATARAX/VISTARIL) 25 MG tablet Take 0.5-1 tablets (12.5-25 mg total) by mouth every 8 (eight) hours as needed for itching. 30 tablet 0   predniSONE (DELTASONE) 20 MG tablet Take 2 tablets daily with breakfast. 10 tablet 0   propranolol (INDERAL) 10 MG tablet TAKE 1 TABLET BY MOUTH 1 HOUR BEFORE REPRESENTATION, CAN REPEAT IF NEEDED 6 HOURS LATER 30 tablet 0   scopolamine (TRANSDERM-SCOP) 1 MG/3DAYS Place 1 patch (1.5 mg total) onto the skin every 3 (three) days. 3 patch 0   No facility-administered medications prior to visit.    ROS:  Review of Systems  Constitutional:  Negative for  fever.  Gastrointestinal:  Negative for blood in stool, constipation, diarrhea, nausea and vomiting.  Genitourinary:  Positive for genital sores and pelvic pain. Negative for dyspareunia, dysuria, flank pain, frequency, hematuria, urgency, vaginal bleeding, vaginal discharge and vaginal pain.  Musculoskeletal:  Negative for back pain.  Skin:  Negative for rash.  BREAST: No symptoms   OBJECTIVE:   Vitals:  BP 110/80   Ht 5' 6" (1.676 m)   Wt 172 lb (78 kg)   LMP 01/20/2021 (Approximate)   BMI 27.76 kg/m   Physical Exam Vitals reviewed.  Constitutional:      Appearance: She is well-developed.  Pulmonary:     Effort: Pulmonary effort is normal.  Genitourinary:    General: Normal vulva.     Pubic Area: No rash.      Labia:        Right: No rash, tenderness or lesion.        Left: No rash, tenderness or lesion.      Vagina: Normal. No vaginal discharge, erythema or tenderness.     Cervix: Normal.     Uterus: Normal. Not enlarged and not tender.      Adnexa: Left adnexa normal.       Right: Tenderness present. No mass.         Left: No mass or tenderness.      Musculoskeletal:        General: Normal range of motion.     Cervical back: Normal range of motion.  Skin:    General: Skin is warm and dry.  Neurological:     General: No focal deficit present.     Mental Status: She is alert and oriented to person, place, and time.  Psychiatric:        Mood and Affect: Mood normal.        Behavior: Behavior normal.        Thought Content: Thought content normal.        Judgment: Judgment normal.    Assessment/Plan: RLQ abdominal pain - Plan: US PELVIS TRANSVAGINAL NON-OB (TV ONLY); pos sx and exam; hx of ovar cysts. Check GYN u/s. Will f/u with results. NSAIDs BID/heating pad. If u/s neg, most likely MSK due to sx increase with movements. F/u prn.   Vaginal lesion--resolving; most likely due to clogged pores/hair follicle. Can do warm compresses prn. Reassurance. F/u prn.  Return in about 1 week (around 02/22/2021) for GYN u/s at [facility] for RLQ pain, [facility] to call with results.  Alicia B. Copland, PA-C 02/16/2021 3:36 PM       

## 2021-02-17 NOTE — Addendum Note (Signed)
Addended by: Ardeth Perfect B on: 06/08/5518 10:00 AM   Modules accepted: Orders

## 2021-02-23 ENCOUNTER — Other Ambulatory Visit: Payer: Self-pay

## 2021-02-23 ENCOUNTER — Ambulatory Visit (HOSPITAL_COMMUNITY)
Admission: RE | Admit: 2021-02-23 | Discharge: 2021-02-23 | Disposition: A | Discharge status: Home / Self Care | Payer: 59 | Source: Ambulatory Visit | Attending: Obstetrics and Gynecology | Admitting: Obstetrics and Gynecology

## 2021-02-23 DIAGNOSIS — R1031 Right lower quadrant pain: Secondary | ICD-10-CM | POA: Insufficient documentation

## 2021-02-25 ENCOUNTER — Other Ambulatory Visit: Payer: 59

## 2021-02-28 ENCOUNTER — Encounter (HOSPITAL_BASED_OUTPATIENT_CLINIC_OR_DEPARTMENT_OTHER): Payer: Self-pay

## 2021-02-28 ENCOUNTER — Other Ambulatory Visit: Payer: Self-pay

## 2021-02-28 ENCOUNTER — Emergency Department (HOSPITAL_BASED_OUTPATIENT_CLINIC_OR_DEPARTMENT_OTHER)
Admission: EM | Admit: 2021-02-28 | Discharge: 2021-02-28 | Disposition: A | Discharge status: Home / Self Care | Payer: 59 | Attending: Emergency Medicine | Admitting: Emergency Medicine

## 2021-02-28 ENCOUNTER — Encounter: Payer: Self-pay | Admitting: Emergency Medicine

## 2021-02-28 ENCOUNTER — Emergency Department (HOSPITAL_BASED_OUTPATIENT_CLINIC_OR_DEPARTMENT_OTHER): Payer: 59

## 2021-02-28 ENCOUNTER — Ambulatory Visit
Admission: EM | Admit: 2021-02-28 | Discharge: 2021-02-28 | Disposition: A | Discharge status: Home / Self Care | Payer: 59 | Attending: Internal Medicine | Admitting: Internal Medicine

## 2021-02-28 DIAGNOSIS — D259 Leiomyoma of uterus, unspecified: Secondary | ICD-10-CM | POA: Diagnosis not present

## 2021-02-28 DIAGNOSIS — M5441 Lumbago with sciatica, right side: Secondary | ICD-10-CM | POA: Diagnosis not present

## 2021-02-28 DIAGNOSIS — R1031 Right lower quadrant pain: Secondary | ICD-10-CM

## 2021-02-28 LAB — URINALYSIS, ROUTINE W REFLEX MICROSCOPIC
Bilirubin Urine: NEGATIVE
Glucose, UA: NEGATIVE mg/dL
Hgb urine dipstick: NEGATIVE
Ketones, ur: NEGATIVE mg/dL
Leukocytes,Ua: NEGATIVE
Nitrite: NEGATIVE
Protein, ur: NEGATIVE mg/dL
Specific Gravity, Urine: 1.021 (ref 1.005–1.030)
pH: 7 (ref 5.0–8.0)

## 2021-02-28 LAB — CBC WITH DIFFERENTIAL/PLATELET
Abs Immature Granulocytes: 0.01 10*3/uL (ref 0.00–0.07)
Basophils Absolute: 0.1 10*3/uL (ref 0.0–0.1)
Basophils Relative: 1 %
Eosinophils Absolute: 0.1 10*3/uL (ref 0.0–0.5)
Eosinophils Relative: 2 %
HCT: 36.8 % (ref 36.0–46.0)
Hemoglobin: 12.4 g/dL (ref 12.0–15.0)
Immature Granulocytes: 0 %
Lymphocytes Relative: 39 %
Lymphs Abs: 1.7 10*3/uL (ref 0.7–4.0)
MCH: 26.1 pg (ref 26.0–34.0)
MCHC: 33.7 g/dL (ref 30.0–36.0)
MCV: 77.5 fL — ABNORMAL LOW (ref 80.0–100.0)
Monocytes Absolute: 0.3 10*3/uL (ref 0.1–1.0)
Monocytes Relative: 6 %
Neutro Abs: 2.3 10*3/uL (ref 1.7–7.7)
Neutrophils Relative %: 52 %
Platelets: 328 10*3/uL (ref 150–400)
RBC: 4.75 MIL/uL (ref 3.87–5.11)
RDW: 14.6 % (ref 11.5–15.5)
WBC: 4.5 10*3/uL (ref 4.0–10.5)
nRBC: 0 % (ref 0.0–0.2)

## 2021-02-28 LAB — COMPREHENSIVE METABOLIC PANEL
ALT: 7 U/L (ref 0–44)
AST: 12 U/L — ABNORMAL LOW (ref 15–41)
Albumin: 4.3 g/dL (ref 3.5–5.0)
Alkaline Phosphatase: 43 U/L (ref 38–126)
Anion gap: 7 (ref 5–15)
BUN: 10 mg/dL (ref 6–20)
CO2: 26 mmol/L (ref 22–32)
Calcium: 8.9 mg/dL (ref 8.9–10.3)
Chloride: 105 mmol/L (ref 98–111)
Creatinine, Ser: 0.69 mg/dL (ref 0.44–1.00)
GFR, Estimated: >60 mL/min (ref >60)
Glucose, Bld: 84 mg/dL (ref 70–99)
Potassium: 3.4 mmol/L — ABNORMAL LOW (ref 3.5–5.1)
Sodium: 138 mmol/L (ref 135–145)
Total Bilirubin: 0.9 mg/dL (ref 0.3–1.2)
Total Protein: 7.9 g/dL (ref 6.5–8.1)

## 2021-02-28 LAB — POCT URINALYSIS DIP (MANUAL ENTRY)
Bilirubin, UA: NEGATIVE
Glucose, UA: NEGATIVE mg/dL
Ketones, POC UA: NEGATIVE mg/dL
Leukocytes, UA: NEGATIVE
Nitrite, UA: NEGATIVE
Protein Ur, POC: NEGATIVE mg/dL
Spec Grav, UA: >=1.03 — AB (ref 1.010–1.025)
Urobilinogen, UA: 0.2 E.U./dL
pH, UA: 5 (ref 5.0–8.0)

## 2021-02-28 LAB — LIPASE, BLOOD: Lipase: 51 U/L (ref 11–51)

## 2021-02-28 LAB — PREGNANCY, URINE: Preg Test, Ur: NEGATIVE

## 2021-02-28 MED ORDER — LIDOCAINE 5 % EX PTCH: 1.0000 | MEDICATED_PATCH | CUTANEOUS | 0 refills | Status: DC

## 2021-02-28 MED ORDER — DICLOFENAC SODIUM 50 MG PO TBEC: 50.0000 mg | DELAYED_RELEASE_TABLET | Freq: Two times a day (BID) | ORAL | 0 refills | Status: AC

## 2021-02-28 MED ORDER — METHOCARBAMOL 500 MG PO TABS: 500.0000 mg | ORAL_TABLET | Freq: Two times a day (BID) | ORAL | 0 refills | Status: DC

## 2021-02-28 MED ORDER — IOHEXOL 350 MG/ML SOLN
75.0000 mL | Freq: Once | INTRAVENOUS | Status: AC | PRN
  Administered 2021-02-28: 75 mL via INTRAVENOUS

## 2021-02-28 MED ORDER — LIDOCAINE 5 % EX PTCH
1.0000 | MEDICATED_PATCH | CUTANEOUS | Status: DC
  Administered 2021-02-28: 1 via TRANSDERMAL
  Filled 2021-02-28: qty 1

## 2021-02-28 NOTE — Discharge Instructions (Addendum)
Take Robaxin and diclofenac as needed as prescribed for pain.  You can also apply Lidoderm patches to your back as needed as prescribed.  Recommend gentle exercises/stretching after warm compresses for 20 minutes at a time. Follow-up with your primary care provider for recheck and to discuss referral for physical therapy.  Your CT today shows an abnormality of the liver that will be better evaluated with an MRI. This study is done outpatient and can be ordered by a PCP or GI. Referral to GI for further work up, please call to schedule an appointment.

## 2021-02-28 NOTE — ED Triage Notes (Signed)
Patient c/o right lower abdominal pain x 1 month.  Patient had an x-ray & u/s this past Wednesday, completely normal.  No urinary sx's, no nausea or vomiting.

## 2021-02-28 NOTE — ED Notes (Signed)
Patient was at [facility] earlier this AM. Lab Results from UC are in Process or have Resulted from UC.

## 2021-02-28 NOTE — ED Provider Notes (Signed)
[facility] EMERGENCY DEPT Provider Note   CSN: [id number] Arrival date & time: 02/28/21  1101     History Chief Complaint  Patient presents with   Abdominal Pain    Rebecca Sexton is a 45 y.o. female.  45 year old female presents with complaint of right lower quadrant abdominal pain which radiates to her right back for the past month or so.  Patient denies falls or injuries however has been at bedside notes that they did move furniture in mid August, unsure how that relates to her pain, did not recall injury at the time.  Denies nausea, vomiting, changes in bowel or bladder habits.  Patient went to her gynecologist thinking that she might have an ovarian cyst, ultrasound showed uterine fibroids, negative for ovarian cyst and was advised to follow-up with her primary care provider.  Patient does not have a primary care currently so she went to urgent care today who sent her to the emergency room for a scan.  Pain is worse with movement, specifically bending over, improves with lying flat and still.  No other complaints or concerns today.      Past Medical History:  Diagnosis Date   Anxiety    Phreesia 04/19/2020   Blood type, Rh positive    BRCA negative 03/2016   MyRisk neg except ATM VUS   Family history of breast cancer 03/2016   MyRisk neg   Family history of ovarian cancer    PAT AUNT   Genetic testing of female 03/2016   ATM VUS   History of mammogram 06/21/2011; [id number]   BIRADS 2 BENIGN @ [place];  NEG   History of Papanicolaou smear of cervix 03/06/2013; 03/22/2016   -/-; -/-   Increased risk of breast cancer 03/2016   IBIS=24.4% PER MY[facility]AD   Ovarian cyst 11/2013   RIGHT OV CYSTS   Sickle cell trait ([place])    Twin pregnancy delivered 05/15/2007   FEMALE/FEMALE @ 37WKS    Patient Active Problem List   Diagnosis Date Noted   GAD (generalized anxiety disorder)/Social Anxiety  04/19/2020   Vitamin D deficiency 01/15/2018   Overweight (BMI 25.0-29.9)  76/16/0737   Fear of public speaking 10/62/6948   Increased risk of breast cancer 03/29/2016    Past Surgical History:  Procedure Laterality Date   CESAREAN SECTION  05/15/2007   TWINS   FOOT SURGERY  2006   LEFT     OB History     Gravida  2   Para  2   Term  2   Preterm      AB      Living  3      SAB      IAB      Ectopic      Multiple  1   Live Births  3           Family History  Problem Relation Age of Onset   Hypertension Mother    Aneurysm Mother    Hyperlipidemia Mother    Atrial fibrillation Mother    Cancer Father 24       Lung/Cancer    Stroke Father    Heart disease Father        Heart attack at death    Hypertension Sister    Other Sister        TWIN PREG   Breast cancer Maternal Aunt 60   Diabetes Maternal Uncle    Breast cancer Paternal Aunt 50   Breast cancer Paternal  Aunt 53   Other Cousin        TWIN PREG   Ovarian cancer Paternal Aunt 62    Social History   Tobacco Use   Smoking status: Never   Smokeless tobacco: Never  Vaping Use   Vaping Use: Never used  Substance Use Topics   Alcohol use: No   Drug use: No    Home Medications Prior to Admission medications   Medication Sig Start Date End Date Taking? Authorizing Provider  diclofenac (VOLTAREN) 50 MG EC tablet Take 1 tablet (50 mg total) by mouth 2 (two) times daily for 10 days. 02/28/21 03/10/21 Yes Tacy Learn, PA-C  lidocaine (LIDODERM) 5 % Place 1 patch onto the skin daily. Remove & Discard patch within 12 hours or as directed by MD 02/28/21  Yes Tacy Learn, PA-C  methocarbamol (ROBAXIN) 500 MG tablet Take 1 tablet (500 mg total) by mouth 2 (two) times daily. 02/28/21  Yes Tacy Learn, PA-C  busPIRone (BUSPAR) 7.5 MG tablet Take 1 tablet (7.5 mg total) by mouth 2 (two) times daily. 10/29/85   Copland, Deirdre Evener, PA-C  Cholecalciferol (VITAMIN D) 2000 units CAPS Take by mouth. Reported on 10/26/2015    [provider]    Allergies     Patient has no known allergies.  Review of Systems   Review of Systems  Constitutional:  Negative for chills and fever.  Respiratory:  Negative for shortness of breath.   Cardiovascular:  Negative for chest pain.  Gastrointestinal:  Positive for abdominal pain. Negative for constipation, diarrhea, nausea and vomiting.  Genitourinary:  Negative for dysuria, frequency and hematuria.  Musculoskeletal:  Positive for back pain and myalgias. Negative for gait problem.  Skin:  Negative for rash and wound.  Allergic/Immunologic: Negative for immunocompromised state.  Neurological:  Negative for weakness and numbness.  Hematological:  Negative for adenopathy.  Psychiatric/Behavioral:  Negative for confusion.   All other systems reviewed and are negative.  Physical Exam Updated Vital Signs BP 121/85   Pulse 72   Temp 98.1 F (36.7 C) (Oral)   Resp 18   Ht _0  (1.676 m)   Wt 77.1 kg   SpO2 100%   BMI 27.43 kg/m   Physical Exam Vitals and nursing note reviewed.  Constitutional:      General: She is not in acute distress.    Appearance: She is well-developed. She is not diaphoretic.  HENT:     Head: Normocephalic and atraumatic.  Cardiovascular:     Rate and Rhythm: Normal rate and regular rhythm.     Heart sounds: Normal heart sounds.  Pulmonary:     Effort: Pulmonary effort is normal.     Breath sounds: Normal breath sounds.  Abdominal:     Palpations: Abdomen is soft.     Tenderness: There is abdominal tenderness in the right lower quadrant. There is no right CVA tenderness, left CVA tenderness, guarding or rebound.     Comments: Mild tenderness right lower quadrant, no guarding or rebound tenderness.  Musculoskeletal:     Cervical back: No tenderness or bony tenderness.     Thoracic back: No tenderness or bony tenderness.     Lumbar back: Tenderness present. Positive right straight leg raise test and positive left straight leg raise test.     Comments: Pain more so on  the right with right leg extension. TTP right lumbar para spinous muscles.   Skin:    General: Skin is warm and dry.  Findings: No erythema or rash.  Neurological:     Mental Status: She is alert and oriented to person, place, and time.     Sensory: Sensation is intact.     Motor: No weakness.     Deep Tendon Reflexes: Babinski sign absent on the right side. Babinski sign absent on the left side.     Reflex Scores:      Patellar reflexes are 1+ on the right side and 1+ on the left side.      Achilles reflexes are 1+ on the right side and 1+ on the left side. Psychiatric:        Behavior: Behavior normal.    ED Results / Procedures / Treatments   Labs (all labs ordered are listed, but only abnormal results are displayed) Labs Reviewed  CBC WITH DIFFERENTIAL/PLATELET - Abnormal; Notable for the following components:      Result Value   MCV 77.5 (*)    All other components within normal limits  COMPREHENSIVE METABOLIC PANEL - Abnormal; Notable for the following components:   Potassium 3.4 (*)    AST 12 (*)    All other components within normal limits  LIPASE, BLOOD  URINALYSIS, ROUTINE W REFLEX MICROSCOPIC  PREGNANCY, URINE    EKG None  Radiology CT Abdomen Pelvis W Contrast  Result Date: 02/28/2021 CLINICAL DATA:  Right lower quadrant abdominal pain x1 month. Hematuria at urgent care this morning. EXAM: CT ABDOMEN AND PELVIS WITH CONTRAST TECHNIQUE: Multidetector CT imaging of the abdomen and pelvis was performed using the standard protocol following bolus administration of intravenous contrast. CONTRAST:  54m OMNIPAQUE IOHEXOL 350 MG/ML SOLN COMPARISON:  None. FINDINGS: Lower chest: No acute abnormality. Hepatobiliary: In segment IVA, there is a slightly hyperattenuating irregular mass spanning 3.3 cm transverse x 2.7 cm AP x 2.6 cm craniocaudal dimension (series 2, image 12; series 5, image 57). A 0.4 cm round hypodensity in segment VI is too small to characterize but  statistically likely represents a benign hepatic cyst (series 2, image 36; series 6, image 34). A focal vague area of hypodensity noted along the falciform ligament in segment IVB likely represents focal fatty deposition (series 2, image 22). Diffuse dilatation of the common bile duct measuring up to 0.9 cm (series 2, image 29). No radiopaque gallstone within the gallbladder or dilated bile duct. No abnormal distention of the gallbladder, gallbladder wall thickening, or pericholecystic fluid. There is mild intrahepatic bile duct dilatation. Pancreas: Unremarkable. No pancreatic ductal dilatation or surrounding inflammatory changes. Spleen: Normal in size without focal abnormality. Adrenals/Urinary Tract: Adrenal glands are unremarkable. Kidneys are normal, without renal calculi, focal lesion, or hydronephrosis. Bladder is unremarkable. Stomach/Bowel: Stomach is within normal limits. Appendix appears normal (series 5, image 52). No evidence of bowel wall thickening, distention, or inflammatory changes. Vascular/Lymphatic: No significant vascular findings are present. No enlarged abdominal or pelvic lymph nodes. Reproductive: Multiple intramural and subserosal uterine fibroids measuring up to 4.7 cm at the right anterior aspect of the uterus. Adnexa are unremarkable. Other: No abdominal wall hernia or abnormality. No abdominopelvic ascites. Musculoskeletal: Moderate to severe degenerative disc disease at L5-S1 with vacuum disc phenomenon. No suspicious osseous lesions. IMPRESSION: 1. No acute abnormality in the abdomen or pelvis. Specifically, no appendicitis as queried. 2. Indeterminate 3.3 cm mass versus vascular malformation in the left lobe of the liver (segment IV a). When the patient is clinically stable and able to follow directions and hold their breath (preferably as an outpatient), further evaluation with dedicated  abdominal MRI is recommended. 3. Diffuse dilatation of the common bile duct with mild  intrahepatic bile duct dilatation. No radiopaque gallstones or findings of cholecystitis. Recommend further evaluation with MRCP when the patient is clinically stable. 4. Multiple uterine leiomyomas. Electronically Signed   By: Ileana Roup M.D.   On: 02/28/2021 16:11    Procedures Procedures   Medications Ordered in ED Medications  lidocaine (LIDODERM) 5 % 1 patch (1 patch Transdermal Patch Applied 02/28/21 1406)  iohexol (OMNIPAQUE) 350 MG/ML injection 75 mL (75 mLs Intravenous Contrast Given 02/28/21 1505)    ED Course  I have reviewed the triage vital signs and the nursing notes.  Pertinent labs & imaging results that were available during my care of the patient were reviewed by me and considered in my medical decision making (see chart for details).  Clinical Course as of 02/28/21 1701  Mon Feb 28, 1530  [id number] 45 year old female with complaint of pain in her RLQ to right lower back, ongoing for the past month or so.  Exam consistent with musculoskeletal source, pain is reproducible with palpation of right lumbar paraspinous muscles and with straight leg raise, right more so than left.  Plan is to treat as musculoskeletal pain with anti-inflammatory muscle relaxant and Lidoderm patch. CT obtained for further evaluation which shows cyst versus mass in the liver and recommends MRI as well as MRCP for abnormal gallbladder without evidence of acute cholecystitis.  Patient is afebrile, LFTs are normal, bilirubin normal.  Her labs overall are reassuring including an unremarkable CBC, CMP, lipase, urinalysis and negative pregnancy test.  Vitals are stable with O2 100% on room air. Discussed results in their entirety with patient, recommend follow-up with primary care provider.  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  Advised to discuss her results with her gynecologist who may or may not be able to order the further studies, she is referred to GI as well.  Given  return to ER precautions. [LM]    Clinical Course User Index [LM] Roque Lias   MDM Rules/Calculators/A&P                           Final Clinical Impression(s) / ED Diagnoses Final diagnoses:  Acute right-sided low back pain with right-sided sciatica  Right lower quadrant abdominal pain  Uterine leiomyoma, unspecified location    Rx / DC Orders ED Discharge Orders          Ordered    methocarbamol (ROBAXIN) 500 MG tablet  2 times daily        02/28/21 1617    diclofenac (VOLTAREN) 50 MG EC tablet  2 times daily        02/28/21 1617    lidocaine (LIDODERM) 5 %  Every 24 hours        02/28/21 1617             Tacy Learn, PA-C 02/28/21 [place], Ankit, MD 03/01/21 [phone]

## 2021-02-28 NOTE — ED Provider Notes (Signed)
[facility]    CSN: [id number] Arrival date & time: 02/28/21  4268      History   Chief Complaint Chief Complaint  Patient presents with   Abdominal Pain    HPI Rebecca Sexton is a 45 y.o. female.   Patient presents with right lower abdominal pain that has been present for approximately 1 month.  Pain is described as crampy and has become more constant over the past few days.  Pain is aggravated by movement.  Pain starts in the right lower abdomen and radiates around to right lower back.  Denies any urinary burning, urinary frequency, vaginal discharge, hematuria.  Denies any nausea, vomiting, diarrhea.  Patient is having regular bowel movements daily.  Denies any blood in stool.  Denies any chest pain or shortness of breath.  Denies any known fevers.  Patient was recently seen by OB/GYN, and a transvaginal ultrasound and an abdominal ultrasound were completed.  Patient reports that both ultrasounds were normal.  Patient was advised by OB/GYN to follow-up with PCP, although patient states that her PCP is no longer at previous location and is not able to get in with any other provider at that location at this time.  Patient has been taking ibuprofen with minimal improvement in symptoms.   Abdominal Pain  Past Medical History:  Diagnosis Date   Anxiety    Phreesia 04/19/2020   Blood type, Rh positive    BRCA negative 03/2016   MyRisk neg except ATM VUS   Family history of breast cancer 03/2016   MyRisk neg   Family history of ovarian cancer    PAT AUNT   Genetic testing of female 03/2016   ATM VUS   History of mammogram 06/21/2011; [id number]   BIRADS 2 BENIGN @ [place];  NEG   History of Papanicolaou smear of cervix 03/06/2013; 03/22/2016   -/-; -/-   Increased risk of breast cancer 03/2016   IBIS=24.4% PER [facility]   Ovarian cyst 11/2013   RIGHT OV CYSTS   Sickle cell trait ([place])    Twin pregnancy delivered 05/15/2007   FEMALE/FEMALE @ 37WKS    Patient Active  Problem List   Diagnosis Date Noted   GAD (generalized anxiety disorder)/Social Anxiety  04/19/2020   Vitamin D deficiency 01/15/2018   Overweight (BMI 25.0-29.9) 97/98/9211   Fear of public speaking 94/17/4081   Increased risk of breast cancer 03/29/2016    Past Surgical History:  Procedure Laterality Date   CESAREAN SECTION  05/15/2007   TWINS   FOOT SURGERY  2006   LEFT    OB History     Gravida  2   Para  2   Term  2   Preterm      AB      Living  3      SAB      IAB      Ectopic      Multiple  1   Live Births  3            Home Medications    Prior to Admission medications   Medication Sig Start Date End Date Taking? Authorizing Provider  busPIRone (BUSPAR) 7.5 MG tablet Take 1 tablet (7.5 mg total) by mouth 2 (two) times daily. 08/31/79  Yes Copland, Deirdre Evener, PA-C  Cholecalciferol (VITAMIN D) 2000 units CAPS Take by mouth. Reported on 10/26/2015   Yes [provider]    Family History Family History  Problem Relation Age of Onset  Hypertension Mother    Aneurysm Mother    Hyperlipidemia Mother    Atrial fibrillation Mother    Cancer Father 32       Lung/Cancer    Stroke Father    Heart disease Father        Heart attack at death    Hypertension Sister    Other Sister        TWIN PREG   Breast cancer Maternal Aunt 60   Diabetes Maternal Uncle    Breast cancer Paternal Aunt 99   Breast cancer Paternal Aunt 3   Other Cousin        TWIN PREG   Ovarian cancer Paternal Aunt 46    Social History Social History   Tobacco Use   Smoking status: Never   Smokeless tobacco: Never  Vaping Use   Vaping Use: Never used  Substance Use Topics   Alcohol use: No   Drug use: No     Allergies   Patient has no known allergies.   Review of Systems Review of Systems Per HPI  Physical Exam Triage Vital Signs ED Triage Vitals  Enc Vitals Group     BP 02/28/21 0821 133/86     Pulse Rate 02/28/21 0821 80     Resp 02/28/21  0821 18     Temp 02/28/21 0821 98.7 F (37.1 C)     Temp Source 02/28/21 0821 Oral     SpO2 02/28/21 0821 96 %     Weight 02/28/21 0823 170 lb (77.1 kg)     Height 02/28/21 0823 _0  (1.676 m)     Head Circumference --      Peak Flow --      Pain Score 02/28/21 0822 7     Pain Loc --      Pain Edu? --      Excl. in [place]? --    No data found.  Updated Vital Signs BP 133/86 (BP Location: Left Arm)   Pulse 80   Temp 98.7 F (37.1 C) (Oral)   Resp 18   Ht _1  (1.676 m)   Wt 170 lb (77.1 kg)   SpO2 96%   BMI 27.44 kg/m   Visual Acuity Right Eye Distance:   Left Eye Distance:   Bilateral Distance:    Right Eye Near:   Left Eye Near:    Bilateral Near:     Physical Exam Constitutional:      General: She is not in acute distress.    Appearance: Normal appearance. She is not ill-appearing, toxic-appearing or diaphoretic.  HENT:     Head: Normocephalic and atraumatic.     Right Ear: Tympanic membrane and ear canal normal.     Left Ear: Tympanic membrane and ear canal normal.     Nose: Nose normal.     Mouth/Throat:     Mouth: Mucous membranes are moist.     Pharynx: No posterior oropharyngeal erythema.  Eyes:     Extraocular Movements: Extraocular movements intact.     Conjunctiva/sclera: Conjunctivae normal.     Pupils: Pupils are equal, round, and reactive to light.  Cardiovascular:     Rate and Rhythm: Normal rate and regular rhythm.     Pulses: Normal pulses.     Heart sounds: Normal heart sounds.  Pulmonary:     Effort: Pulmonary effort is normal. No respiratory distress.     Breath sounds: Normal breath sounds. No stridor. No wheezing, rhonchi or rales.  Abdominal:  General: Bowel sounds are normal. There is no distension.     Palpations: Abdomen is soft.     Tenderness: There is abdominal tenderness in the right lower quadrant.     Hernia: No hernia is present.  Musculoskeletal:     Cervical back: Normal.     Thoracic back: Normal.     Lumbar back:  Tenderness present. No swelling or edema. Negative right straight leg raise test and negative left straight leg raise test.       Back:     Comments: Tenderness to palpation to right lumbar region.  Skin:    General: Skin is warm and dry.  Neurological:     General: No focal deficit present.     Mental Status: She is alert and oriented to person, place, and time. Mental status is at baseline.  Psychiatric:        Mood and Affect: Mood normal.        Behavior: Behavior normal.        Thought Content: Thought content normal.        Judgment: Judgment normal.     UC Treatments / Results  Labs (all labs ordered are listed, but only abnormal results are displayed) Labs Reviewed  POCT URINALYSIS DIP (MANUAL ENTRY) - Abnormal; Notable for the following components:      Result Value   Spec Grav, UA >=1.030 (*)    Blood, UA trace-intact (*)    All other components within normal limits  COMPREHENSIVE METABOLIC PANEL  CBC  AMYLASE  LIPASE    EKG   Radiology No results found.  Procedures Procedures (including critical care time)  Medications Ordered in UC Medications - No data to display  Initial Impression / Assessment and Plan / UC Course  I have reviewed the triage vital signs and the nursing notes.  Pertinent labs & imaging results that were available during my care of the patient were reviewed by me and considered in my medical decision making (see chart for details).     Differential diagnoses include urinary tract infection, kidney stone, muscular strain.  Urinalysis was not definitive for urinary tract infection, although it did have red blood cells present that could indicate kidney stone that is causing pain.  Do not think urine culture is necessary at this time as patient does not have any urinary symptoms.  Pain is also reproducible by movement and palpation, so this could indicate that it is a muscular strain.  Patient was advised to continue over-the-counter pain  relievers and to alternate ice and heat application to affected area.  CBC, CMP, amylase, lipase pending to rule out other worrisome etiologies.  Patient was advised that it may be beneficial to go to the hospital for CT imaging to rule out kidney stone or other abdominal abnormalities, but patient declined at this time. Patient wishes to wait on a blood work to determine if there are any abnormalities.  Advised patient to go to the hospital if pain significantly worsens.  PCP assistance also requested for patient.  Patient will need to follow-up with PCP for further evaluation and management of long-term abdominal pain.  Discussed strict return precautions. Patient verbalized understanding and is agreeable with plan.  Final Clinical Impressions(s) / UC Diagnoses   Final diagnoses:  Right lower quadrant pain     Discharge Instructions      Your urine did show blood in urine which could indicate kidney stone.  Please go to the hospital if pain severely  worsens or does not change.  Primary care assistance has been requested for you.  Someone from [facility] health will reach out to you to set up this appointment.  Your blood work is pending.  We will call you if there are any abnormalities.     ED Prescriptions   None    PDMP not reviewed this encounter.   Odis Luster, [place] 02/28/21 [phone]

## 2021-02-28 NOTE — Discharge Instructions (Addendum)
Your urine did show blood in urine which could indicate kidney stone.  Please go to the hospital if pain severely worsens or does not change.  Primary care assistance has been requested for you.  Someone from [facility] health will reach out to you to set up this appointment.  Your blood work is pending.  We will call you if there are any abnormalities.

## 2021-02-28 NOTE — ED Triage Notes (Signed)
Patient here POV from Home with ABD Pain.  Pain has been present for approximately 1 month. Patient was at [facility] this AM and was instructed to come to ED for "Scan" due to Blood in Urine. Pain is mainly located in the Mid ABD and radiates to Right ABD to Back.  NAD Noted during Triage. No Urinary Symptoms. No N/V/D. No Fevers. Pain is worse with Certain Movements.

## 2021-03-01 LAB — COMPREHENSIVE METABOLIC PANEL
ALT: 4 IU/L (ref 0–32)
AST: 10 IU/L (ref 0–40)
Albumin/Globulin Ratio: 1.5 (ref 1.2–2.2)
Albumin: 4.1 g/dL (ref 3.8–4.8)
Alkaline Phosphatase: 49 IU/L (ref 44–121)
BUN/Creatinine Ratio: 8 — ABNORMAL LOW (ref 9–23)
BUN: 7 mg/dL (ref 6–24)
Bilirubin Total: 0.8 mg/dL (ref 0.0–1.2)
CO2: 21 mmol/L (ref 20–29)
Calcium: 9 mg/dL (ref 8.7–10.2)
Chloride: 107 mmol/L — ABNORMAL HIGH (ref 96–106)
Creatinine, Ser: 0.83 mg/dL (ref 0.57–1.00)
Globulin, Total: 2.8 g/dL (ref 1.5–4.5)
Glucose: 87 mg/dL (ref 70–99)
Potassium: 4.2 mmol/L (ref 3.5–5.2)
Sodium: 138 mmol/L (ref 134–144)
Total Protein: 6.9 g/dL (ref 6.0–8.5)
eGFR: 88 mL/min/{1.73_m2} (ref >59)

## 2021-03-01 LAB — CBC
Hematocrit: 35.8 % (ref 34.0–46.6)
Hemoglobin: 11.6 g/dL (ref 11.1–15.9)
MCH: 25.9 pg — ABNORMAL LOW (ref 26.6–33.0)
MCHC: 32.4 g/dL (ref 31.5–35.7)
MCV: 80 fL (ref 79–97)
Platelets: 290 10*3/uL (ref 150–450)
RBC: 4.48 x10E6/uL (ref 3.77–5.28)
RDW: 14.7 % (ref 11.7–15.4)
WBC: 2.7 10*3/uL — ABNORMAL LOW (ref 3.4–10.8)

## 2021-03-01 LAB — AMYLASE: Amylase: 100 U/L (ref 31–110)

## 2021-03-01 LAB — LIPASE: Lipase: 35 U/L (ref 14–72)

## 2021-03-02 ENCOUNTER — Other Ambulatory Visit: Payer: Self-pay

## 2021-03-02 ENCOUNTER — Ambulatory Visit (INDEPENDENT_AMBULATORY_CARE_PROVIDER_SITE_OTHER): Payer: 59 | Admitting: Nurse Practitioner

## 2021-03-02 ENCOUNTER — Encounter: Payer: Self-pay | Admitting: Nurse Practitioner

## 2021-03-02 VITALS — BP 102/64 | HR 66 | Temp 98.2°F | Resp 14 | Ht 66.0 in | Wt 171.0 lb

## 2021-03-02 DIAGNOSIS — E559 Vitamin D deficiency, unspecified: Secondary | ICD-10-CM

## 2021-03-02 DIAGNOSIS — K838 Other specified diseases of biliary tract: Secondary | ICD-10-CM | POA: Diagnosis not present

## 2021-03-02 DIAGNOSIS — Z23 Encounter for immunization: Secondary | ICD-10-CM

## 2021-03-02 DIAGNOSIS — F419 Anxiety disorder, unspecified: Secondary | ICD-10-CM

## 2021-03-02 DIAGNOSIS — F40248 Other situational type phobia: Secondary | ICD-10-CM

## 2021-03-02 DIAGNOSIS — F411 Generalized anxiety disorder: Secondary | ICD-10-CM

## 2021-03-02 MED ORDER — BUSPIRONE HCL 7.5 MG PO TABS: 7.5000 mg | ORAL_TABLET | Freq: Two times a day (BID) | ORAL | 1 refills | Status: DC

## 2021-03-02 MED ORDER — PROPRANOLOL HCL 10 MG PO TABS: ORAL_TABLET | ORAL | 1 refills | Status: DC

## 2021-03-02 NOTE — Progress Notes (Addendum)
Subjective:    Patient ID: Rebecca Sexton, female    DOB: 1976-05-13, 45 y.o.   MRN: [id number]  HPI: Rebecca Sexton is a 45 y.o. female presenting for ongoing right lower abdominal pain radiating to back.  Chief Complaint  Patient presents with   Follow-up    Previously seen by Dr Buelah Manis Is fasting   Back Pain    Lower back pain- has been seen at [facility] and given robaxin and voltaren   RIGHT LOWER ABDOMINAL PAIN RADIATING TO BACK  Has been taking Robaxin and using voltaren and it is helping some. Duration: months  Mechanism of injury: none known Location: right side of back, right lower abdominal pain Onset: gradual Severity: moderate to severe Quality: cramping, gnawing when she is sitting Frequency: constant, worse after eating Radiation: yes; right lower abdomen to right back Aggravating factors: sitting for long periods, certain movements Alleviating factors: laying down at night,  Status: better Treatments attempted:  Robaxin and voltaren Relief with NSAIDs?: no Nighttime pain:  no Paresthesias / decreased sensation:  no Bowel / bladder incontinence:  no Fevers:  no Dysuria / urinary frequency:  no Nausea/vomiting: no Diarrhea/constipation: no Blood in stool: no Decreased appetite: yes; eats a little bit and gets full   Of note, she went to the emergency room with the same pain.  A CT was performed which showed:  02/28/2021 CT Abdomen Pelvis W Contrast: IMPRESSION: 1. No acute abnormality in the abdomen or pelvis. Specifically, no appendicitis as queried. 2. Indeterminate 3.3 cm mass versus vascular malformation in the left lobe of the liver (segment IV a). When the patient is clinically stable and able to follow directions and hold their breath (preferably as an outpatient), further evaluation with dedicated abdominal MRI is recommended. 3. Diffuse dilatation of the common bile duct with mild intrahepatic bile duct dilatation. No radiopaque gallstones or  findings of cholecystitis. Recommend further evaluation with MRCP when the patient is clinically stable. 4. Multiple uterine leiomyomas.  ANXIETY/STRESS She takes propranolol as needed for public speaking.  This works well for her.  She is taking Buspar 7.5 mg twice daily and this works well for her.  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  Depression screen [facility] 2/9 03/02/2021 04/19/2020 02/07/2019 01/15/2018 03/13/2016  Decreased Interest 0 0 0 0 0  Down, Depressed, Hopeless 0 0 0 0 0  PHQ - 2 Score 0 0 0 0 0  Altered sleeping 0 0 - - -  Tired, decreased energy 2 0 - - -  Change in appetite 1 0 - - -  Feeling bad or failure about yourself  0 0 - - -  Trouble concentrating 1 2 - - -  Moving slowly or fidgety/restless 0 0 - - -  Suicidal thoughts 0 0 - - -  PHQ-9 Score 4 2 - - -  Difficult doing work/chores Not difficult at all Not difficult at all - - -    GAD 7 : Generalized Anxiety Score 03/02/2021 04/19/2020  Nervous, Anxious, on Edge 0 1  Control/stop worrying 0 0  Worry too much - different things 0 0  Trouble relaxing 0 1  Restless 0 1  Easily annoyed or irritable 0 1  Afraid - awful might happen 0 0  Total GAD 7 Score 0 4  Anxiety Difficulty Not difficult at all Somewhat difficult   VITAMIN D DEFICIENCY Has not been taking Vitamin  D regularly. Duration: years Previous Vitamin D level: 27 in 2020 Current supplementation: not taking regularly  No Known Allergies  Outpatient Encounter Medications as of 03/02/2021  Medication Sig   Cholecalciferol (VITAMIN D) 2000 units CAPS Take by mouth. Reported on 10/26/2015   diclofenac (VOLTAREN) 50 MG EC tablet Take 1 tablet (50 mg total) by mouth 2 (two) times daily for 10 days.   methocarbamol (ROBAXIN) 500 MG tablet Take 1 tablet (500 mg total) by mouth 2 (two) times daily.   [DISCONTINUED] busPIRone (BUSPAR) 7.5 MG tablet Take 1 tablet (7.5 mg total) by mouth 2  (two) times daily.   busPIRone (BUSPAR) 7.5 MG tablet Take 1 tablet (7.5 mg total) by mouth 2 (two) times daily.   propranolol (INDERAL) 10 MG tablet TAKE 1 TABLET BY MOUTH 1 HOUR BEFORE REPRESENTATION, CAN REPEAT IF NEEDED 6 HOURS LATER   [DISCONTINUED] lidocaine (LIDODERM) 5 % Place 1 patch onto the skin daily. Remove & Discard patch within 12 hours or as directed by MD (Patient not taking: Reported on 03/02/2021)   No facility-administered encounter medications on file as of 03/02/2021.    Patient Active Problem List   Diagnosis Date Noted   Intrahepatic bile duct dilation 03/08/2021   GAD (generalized anxiety disorder)/Social Anxiety  04/19/2020   Vitamin D deficiency 01/15/2018   Overweight (BMI 25.0-29.9) 16/02/9603   Fear of public speaking 54/01/8118   Increased risk of breast cancer 03/29/2016    Past Medical History:  Diagnosis Date   Anxiety    Phreesia 04/19/2020   Blood type, Rh positive    BRCA negative 03/2016   MyRisk neg except ATM VUS   Family history of breast cancer 03/2016   MyRisk neg   Family history of ovarian cancer    PAT AUNT   Genetic testing of female 03/2016   ATM VUS   History of mammogram 06/21/2011; [id number]   BIRADS 2 BENIGN @ [place];  NEG   History of Papanicolaou smear of cervix 03/06/2013; 03/22/2016   -/-; -/-   Increased risk of breast cancer 03/2016   IBIS=24.4% PER MY[facility]   Ovarian cyst 11/2013   RIGHT OV CYSTS   Sickle cell trait ([place])    Twin pregnancy delivered 05/15/2007   FEMALE/FEMALE @ 37WKS    Relevant past medical, surgical, family and social history reviewed and updated as indicated. Interim medical history since our last visit reviewed.  Review of Systems Per HPI unless specifically indicated above     Objective:    BP 102/64   Pulse 66   Temp 98.2 F (36.8 C) (Temporal)   Resp 14   Ht 5' 6"  (1.676 m)   Wt 171 lb (77.6 kg)   LMP 01/23/2021 Comment: irregular  SpO2 98%   BMI 27.60 kg/m   Wt Readings from Last  3 Encounters:  03/02/21 171 lb (77.6 kg)  02/28/21 169 lb 15.6 oz (77.1 kg)  02/28/21 170 lb (77.1 kg)    Physical Exam Vitals and nursing note reviewed.  Constitutional:      General: She is not in acute distress.    Appearance: Normal appearance. She is not toxic-appearing.  HENT:     Head: Normocephalic and atraumatic.  Eyes:     General: No scleral icterus.       Right eye: No discharge.        Left eye: No discharge.     Extraocular Movements: Extraocular movements intact.  Cardiovascular:     Heart sounds: Normal heart  sounds. No murmur heard. Pulmonary:     Effort: Pulmonary effort is normal. No respiratory distress.     Breath sounds: Normal breath sounds. No wheezing, rhonchi or rales.  Abdominal:     General: Abdomen is flat. Bowel sounds are normal. There is no distension.     Palpations: Abdomen is soft. There is no mass.     Tenderness: There is abdominal tenderness. There is no right CVA tenderness, left CVA tenderness or guarding. Negative signs include McBurney's sign.     Hernia: No hernia is present.    Skin:    General: Skin is warm and dry.     Capillary Refill: Capillary refill takes less than 2 seconds.     Coloration: Skin is not jaundiced or pale.     Findings: No erythema.  Neurological:     Mental Status: She is alert and oriented to person, place, and time.     Motor: No weakness.     Gait: Gait normal.  Psychiatric:        Mood and Affect: Mood normal.        Behavior: Behavior normal.        Thought Content: Thought content normal.        Judgment: Judgment normal.       Assessment & Plan:   Problem List Items Addressed This Visit       Digestive   Intrahepatic bile duct dilation - Primary    Acute.  Noted on recent CT abdomen/pelvis.  MRCP ordered per Radiologist recommendation and given ongoing RLQ pain.  No red flags today.  Follow up pending results.      Relevant Orders   MR ABDOMEN WITH MRCP W CONTRAST     Other   Vitamin  D deficiency    Chronic.  Discussed use of OTC vitamin D supplementation 1000-2000 IU daily and once taking regularly, can recheck in ~3-6 months.       GAD (generalized anxiety disorder)/Social Anxiety     Chronic.  GAD-7 and PHQ-9 are not elevated today.  Continue Buspar 7.5 mg twice daily.  Follow up in 6 months or with changes in the meantime.      Relevant Medications   busPIRone (BUSPAR) 7.5 MG tablet   Fear of public speaking    Continue use of propranolol 10 mg daily prn public speaking.  Follow up in 6 months.       Relevant Medications   propranolol (INDERAL) 10 MG tablet   Other Visit Diagnoses     Need for immunization against influenza       Relevant Orders   Flu Vaccine QUAD 29moIM (Fluarix, Fluzone & Alfiuria Quad PF) (Completed)        Follow up plan: Return in about 6 months (around 08/31/2021) for pending MRCP results.

## 2021-03-08 DIAGNOSIS — K838 Other specified diseases of biliary tract: Secondary | ICD-10-CM | POA: Insufficient documentation

## 2021-03-08 NOTE — Assessment & Plan Note (Signed)
Acute.  Noted on recent CT abdomen/pelvis.  MRCP ordered per Radiologist recommendation and given ongoing RLQ pain.  No red flags today.  Follow up pending results.

## 2021-03-08 NOTE — Assessment & Plan Note (Signed)
Chronic.  Discussed use of OTC vitamin D supplementation 1000-2000 IU daily and once taking regularly, can recheck in ~3-6 months.

## 2021-03-08 NOTE — Assessment & Plan Note (Signed)
Chronic.  GAD-7 and PHQ-9 are not elevated today.  Continue Buspar 7.5 mg twice daily.  Follow up in 6 months or with changes in the meantime.

## 2021-03-08 NOTE — Assessment & Plan Note (Signed)
Continue use of propranolol 10 mg daily prn public speaking.  Follow up in 6 months.

## 2021-04-05 ENCOUNTER — Telehealth: Payer: Self-pay | Admitting: Nurse Practitioner

## 2021-04-05 NOTE — Telephone Encounter (Signed)
Received call from patient to request medical guidance; wants to know what to do next about musculoskeletal issue. Please advise at [phone]

## 2021-04-05 NOTE — Telephone Encounter (Signed)
Spoke with pt and she states her R sided abd/back pain had subsided for several weeks, is now back today and is worse than before. Pt states she has been taking Tylenol and Advil with no relief. Pt states pain started after she bent over to pick up an ironing board. Pt would like to know what else she can do for the pain.  Pt aware Rebecca Sexton is not in the office today.  Please advise. Thank you!

## 2021-04-06 ENCOUNTER — Encounter (HOSPITAL_COMMUNITY): Payer: Self-pay | Admitting: Emergency Medicine

## 2021-04-06 ENCOUNTER — Emergency Department (HOSPITAL_COMMUNITY): Payer: 59

## 2021-04-06 ENCOUNTER — Emergency Department (HOSPITAL_COMMUNITY)
Admission: EM | Admit: 2021-04-06 | Discharge: 2021-04-06 | Disposition: A | Discharge status: Home / Self Care | Payer: 59 | Attending: Emergency Medicine | Admitting: Emergency Medicine

## 2021-04-06 DIAGNOSIS — M545 Low back pain, unspecified: Secondary | ICD-10-CM | POA: Insufficient documentation

## 2021-04-06 DIAGNOSIS — N9489 Other specified conditions associated with female genital organs and menstrual cycle: Secondary | ICD-10-CM | POA: Diagnosis not present

## 2021-04-06 DIAGNOSIS — R1031 Right lower quadrant pain: Secondary | ICD-10-CM | POA: Diagnosis not present

## 2021-04-06 DIAGNOSIS — R0789 Other chest pain: Secondary | ICD-10-CM | POA: Diagnosis not present

## 2021-04-06 DIAGNOSIS — R109 Unspecified abdominal pain: Secondary | ICD-10-CM | POA: Diagnosis present

## 2021-04-06 LAB — CBC WITH DIFFERENTIAL/PLATELET
Abs Immature Granulocytes: 0.02 10*3/uL (ref 0.00–0.07)
Basophils Absolute: 0 10*3/uL (ref 0.0–0.1)
Basophils Relative: 1 %
Eosinophils Absolute: 0.1 10*3/uL (ref 0.0–0.5)
Eosinophils Relative: 2 %
HCT: 33.4 % — ABNORMAL LOW (ref 36.0–46.0)
Hemoglobin: 11.3 g/dL — ABNORMAL LOW (ref 12.0–15.0)
Immature Granulocytes: 1 %
Lymphocytes Relative: 35 %
Lymphs Abs: 1.4 10*3/uL (ref 0.7–4.0)
MCH: 26.3 pg (ref 26.0–34.0)
MCHC: 33.8 g/dL (ref 30.0–36.0)
MCV: 77.7 fL — ABNORMAL LOW (ref 80.0–100.0)
Monocytes Absolute: 0.3 10*3/uL (ref 0.1–1.0)
Monocytes Relative: 8 %
Neutro Abs: 2.1 10*3/uL (ref 1.7–7.7)
Neutrophils Relative %: 53 %
Platelets: 295 10*3/uL (ref 150–400)
RBC: 4.3 MIL/uL (ref 3.87–5.11)
RDW: 14.9 % (ref 11.5–15.5)
WBC: 4 10*3/uL (ref 4.0–10.5)
nRBC: 0 % (ref 0.0–0.2)

## 2021-04-06 LAB — COMPREHENSIVE METABOLIC PANEL
ALT: 11 U/L (ref 0–44)
AST: 16 U/L (ref 15–41)
Albumin: 3.4 g/dL — ABNORMAL LOW (ref 3.5–5.0)
Alkaline Phosphatase: 41 U/L (ref 38–126)
Anion gap: 6 (ref 5–15)
BUN: 7 mg/dL (ref 6–20)
CO2: 24 mmol/L (ref 22–32)
Calcium: 8.8 mg/dL — ABNORMAL LOW (ref 8.9–10.3)
Chloride: 106 mmol/L (ref 98–111)
Creatinine, Ser: 0.87 mg/dL (ref 0.44–1.00)
GFR, Estimated: >60 mL/min (ref >60)
Glucose, Bld: 90 mg/dL (ref 70–99)
Potassium: 3.8 mmol/L (ref 3.5–5.1)
Sodium: 136 mmol/L (ref 135–145)
Total Bilirubin: 1 mg/dL (ref 0.3–1.2)
Total Protein: 6.7 g/dL (ref 6.5–8.1)

## 2021-04-06 LAB — URINALYSIS, ROUTINE W REFLEX MICROSCOPIC
Bilirubin Urine: NEGATIVE
Glucose, UA: NEGATIVE mg/dL
Hgb urine dipstick: NEGATIVE
Ketones, ur: NEGATIVE mg/dL
Leukocytes,Ua: NEGATIVE
Nitrite: NEGATIVE
Protein, ur: NEGATIVE mg/dL
Specific Gravity, Urine: 1.016 (ref 1.005–1.030)
pH: 5 (ref 5.0–8.0)

## 2021-04-06 LAB — I-STAT BETA HCG BLOOD, ED (MC, WL, AP ONLY): I-stat hCG, quantitative: <5 m[IU]/mL (ref <5)

## 2021-04-06 LAB — LIPASE, BLOOD: Lipase: 37 U/L (ref 11–51)

## 2021-04-06 MED ORDER — KETOROLAC TROMETHAMINE 30 MG/ML IJ SOLN
15.0000 mg | Freq: Once | INTRAMUSCULAR | Status: AC
  Administered 2021-04-06: 15 mg via INTRAVENOUS
  Filled 2021-04-06: qty 1

## 2021-04-06 MED ORDER — PREDNISONE 20 MG PO TABS: 40.0000 mg | ORAL_TABLET | Freq: Every day | ORAL | 0 refills | Status: DC

## 2021-04-06 NOTE — Telephone Encounter (Signed)
Please schedule OV.  

## 2021-04-06 NOTE — ED Notes (Signed)
Pt verbalizes understanding of discharge instructions. Opportunity for questions and answers were provided. Pt discharged from the ED.   ?

## 2021-04-06 NOTE — Discharge Instructions (Addendum)
Continue to follow-up with your doctors.  Continue to follow-up for the MRCP to help evaluate the abnormal liver findings from before.  Hopefully the steroids will help.

## 2021-04-06 NOTE — ED Triage Notes (Signed)
Patient complains of intermittent RLQ abdominal pain that started in August 2022, states pain started to get worse last night so she came in today for evaluation. Denies diarrhea, denies urinary symptoms. States she was seen at [facility], orthopedist, and PCP for symptoms with no resolution.

## 2021-04-06 NOTE — ED Provider Notes (Signed)
[facility] EMERGENCY DEPARTMENT Provider Note   CSN: [id number] Arrival date & time: 04/06/21  1006     History Chief Complaint  Patient presents with   Abdominal Pain    Rebecca Sexton is a 45 y.o. female.   Abdominal Pain Associated symptoms: chest pain   Associated symptoms: no hematuria, no shortness of breath, no vaginal bleeding and no vaginal discharge   Patient presents with abdominal pain.  Has had abdominal pain and back pain since August.  Had been seen in the ER and had CT scan which was rather unremarkable except for some biliary dilatation.  Also said previous pelvic ultrasound which showed some fibroids only.  Not currently on her menses.  No vaginal bleeding or discharge.  No dysuria.  No fevers or chills.  No dysuria.  No nausea or vomiting.  Pain not worsened with eating.  Is worse with certain positions.  Also at times relieved by certain positions.  States she saw orthopedic surgery yesterday who thought it was not orthopedic cause.  No weight loss.  Scheduled for an MRCP.    Past Medical History:  Diagnosis Date   Anxiety    Phreesia 04/19/2020   Blood type, Rh positive    BRCA negative 03/2016   MyRisk neg except ATM VUS   Family history of breast cancer 03/2016   MyRisk neg   Family history of ovarian cancer    PAT AUNT   Genetic testing of female 03/2016   ATM VUS   History of mammogram 06/21/2011; [id number]   BIRADS 2 BENIGN @ [place];  NEG   History of Papanicolaou smear of cervix 03/06/2013; 03/22/2016   -/-; -/-   Increased risk of breast cancer 03/2016   IBIS=24.4% PER [facility]   Ovarian cyst 11/2013   RIGHT OV CYSTS   Sickle cell trait ([place])    Twin pregnancy delivered 05/15/2007   FEMALE/FEMALE @ 37WKS    Patient Active Problem List   Diagnosis Date Noted   Intrahepatic bile duct dilation 03/08/2021   GAD (generalized anxiety disorder)/Social Anxiety  04/19/2020   Vitamin D deficiency 01/15/2018   Overweight (BMI  25.0-29.9) 65/46/5035   Fear of public speaking 46/56/8127   Increased risk of breast cancer 03/29/2016    Past Surgical History:  Procedure Laterality Date   CESAREAN SECTION  05/15/2007   TWINS   FOOT SURGERY  2006   LEFT     OB History     Gravida  2   Para  2   Term  2   Preterm      AB      Living  3      SAB      IAB      Ectopic      Multiple  1   Live Births  3           Family History  Problem Relation Age of Onset   Hypertension Mother    Aneurysm Mother    Hyperlipidemia Mother    Atrial fibrillation Mother    Cancer Father 62       Lung/Cancer    Stroke Father    Heart disease Father        Heart attack at death    Hypertension Sister    Other Sister        TWIN PREG   Breast cancer Maternal Aunt 60   Diabetes Maternal Uncle    Breast cancer Paternal Aunt 84  Breast cancer Paternal Aunt 80   Other Cousin        TWIN PREG   Ovarian cancer Paternal Aunt 67    Social History   Tobacco Use   Smoking status: Never   Smokeless tobacco: Never  Vaping Use   Vaping Use: Never used  Substance Use Topics   Alcohol use: No   Drug use: No    Home Medications Prior to Admission medications   Medication Sig Start Date End Date Taking? Authorizing Provider  predniSONE (DELTASONE) 20 MG tablet Take 2 tablets (40 mg total) by mouth daily. 04/06/21  Yes Davonna Belling, MD  busPIRone (BUSPAR) 7.5 MG tablet Take 1 tablet (7.5 mg total) by mouth 2 (two) times daily. 03/02/21   Eulogio Bear, NP  Cholecalciferol (VITAMIN D) 2000 units CAPS Take by mouth. Reported on 10/26/2015    [provider]  methocarbamol (ROBAXIN) 500 MG tablet Take 1 tablet (500 mg total) by mouth 2 (two) times daily. 02/28/21   Tacy Learn, PA-C  propranolol (INDERAL) 10 MG tablet TAKE 1 TABLET BY MOUTH 1 HOUR BEFORE REPRESENTATION, CAN REPEAT IF NEEDED 6 HOURS LATER 03/02/21   Eulogio Bear, NP    Allergies    Patient has no known  allergies.  Review of Systems   Review of Systems  Constitutional:  Positive for appetite change.  HENT:  Negative for congestion.   Respiratory:  Negative for shortness of breath.   Cardiovascular:  Positive for chest pain.  Gastrointestinal:  Positive for abdominal pain.  Endocrine: Negative for polyuria.  Genitourinary:  Negative for flank pain, genital sores, hematuria, vaginal bleeding and vaginal discharge.  Musculoskeletal:  Positive for back pain.  Skin:  Negative for rash.  Neurological:  Negative for tremors and weakness.  Psychiatric/Behavioral:  Negative for agitation.    Physical Exam Updated Vital Signs BP (!) 143/110   Pulse (!) 51   Temp 98.9 F (37.2 C) (Oral)   Resp 17   SpO2 100%   Physical Exam Vitals and nursing note reviewed.  Constitutional:      Appearance: She is well-developed.  HENT:     Head: Atraumatic.  Cardiovascular:     Rate and Rhythm: Regular rhythm.  Pulmonary:     Breath sounds: Normal breath sounds.  Abdominal:     Palpations: There is mass.     Hernia: A hernia is present.  Skin:    General: Skin is warm.     Capillary Refill: Capillary refill takes less than 2 seconds.  Neurological:     Mental Status: She is alert and oriented to person, place, and time.     Motor: No weakness.  Psychiatric:        Mood and Affect: Mood normal.  Some pain with straight leg raise on right.  No real lumbar tenderness.  No rash.  Neurovascular intact in foot.  Strength intact in lower extremity.  ED Results / Procedures / Treatments   Labs (all labs ordered are listed, but only abnormal results are displayed) Labs Reviewed  CBC WITH DIFFERENTIAL/PLATELET - Abnormal; Notable for the following components:      Result Value   Hemoglobin 11.3 (*)    HCT 33.4 (*)    MCV 77.7 (*)    All other components within normal limits  COMPREHENSIVE METABOLIC PANEL - Abnormal; Notable for the following components:   Calcium 8.8 (*)    Albumin 3.4 (*)     All other components within normal  limits  LIPASE, BLOOD  URINALYSIS, ROUTINE W REFLEX MICROSCOPIC  I-STAT BETA HCG BLOOD, ED ([facility], [facility], [facility] ONLY)    EKG None  Radiology US Abdomen Limited RUQ (LIVER/GB)  Result Date: 04/06/2021 CLINICAL DATA:  Dilated common bile duct EXAM: ULTRASOUND ABDOMEN LIMITED RIGHT UPPER QUADRANT COMPARISON:  None. FINDINGS: Gallbladder: No gallstones or wall thickening visualized. No sonographic Murphy sign noted by sonographer. Common bile duct: Diameter: 5 mm Liver: No focal lesion identified. Increased hepatic parenchymal echogenicity. Portal vein is patent on color Doppler imaging with normal direction of blood flow towards the liver. Other: None. IMPRESSION: 1. No cholelithiasis or sonographic evidence of acute cholecystitis. 2. Increased hepatic echogenicity as can be seen with hepatic steatosis. Electronically Signed   By: Kathreen Devoid M.D.   On: 04/06/2021 12:00    Procedures Procedures   Medications Ordered in ED Medications  ketorolac (TORADOL) 30 MG/ML injection 15 mg (15 mg Intravenous Given 04/06/21 1440)    ED Course  I have reviewed the triage vital signs and the nursing notes.  Pertinent labs & imaging results that were available during my care of the patient were reviewed by me and considered in my medical decision making (see chart for details).    MDM Rules/Calculators/A&P                           Patient with current right lower quadrant abdominal pain.  Benign exam and of the abdomen.  No rash.  No lumbar tenderness although does have worse pain with straight leg raise on right.  Reviewed CT scan from couple months ago.  Did show some biliary dilatation.  No appendicitis.  No urinary symptoms.  Ultrasound done today and showed now a normal common bile duct.  Does have a MRCP scheduled.  Unclear cause of the pain particularly if Ortho does not think this is a orthopedic cause, however pain is worse with raising the leg.  We will treat  with steroids and states has not been done.  Follow-up with PCP as needed. Final Clinical Impression(s) / ED Diagnoses Final diagnoses:  RLQ abdominal pain  Right lower quadrant abdominal pain  Right low back pain, unspecified chronicity, unspecified whether sciatica present    Rx / DC Orders ED Discharge Orders          Ordered    predniSONE (DELTASONE) 20 MG tablet  Daily        04/06/21 1452             Davonna Belling, MD 04/06/21 1911

## 2021-04-06 NOTE — Telephone Encounter (Signed)
Pt currently in ED for evaluation.  For MRI they would need to contact [facility] scheduling. We cannot change any of those appts.

## 2021-04-06 NOTE — ED Notes (Signed)
Patient transported to Ultrasound 

## 2021-04-06 NOTE — Telephone Encounter (Signed)
Patient's spouse Gerald Stabs called to request sooner appt for patient to have MRI; currently scheduled for 11/18.  Gerald Stabs stated patient in an enormous amount of pain and is unable to wait until then; also unable to wait until tomorrow for an appt with Janett Billow which is her first available. Gerald Stabs will take spouse to urgent care today.  Please advise at [phone] about sooner MRI appt.

## 2021-04-15 ENCOUNTER — Ambulatory Visit
Admission: RE | Admit: 2021-04-15 | Discharge: 2021-04-15 | Disposition: A | Discharge status: Home / Self Care | Payer: 59 | Source: Ambulatory Visit | Attending: Nurse Practitioner | Admitting: Nurse Practitioner

## 2021-04-15 DIAGNOSIS — K838 Other specified diseases of biliary tract: Secondary | ICD-10-CM

## 2021-04-15 MED ORDER — GADOBENATE DIMEGLUMINE 529 MG/ML IV SOLN
15.0000 mL | Freq: Once | INTRAVENOUS | Status: AC | PRN
  Administered 2021-04-15: 15 mL via INTRAVENOUS

## 2021-04-18 ENCOUNTER — Telehealth: Payer: Self-pay | Admitting: Nurse Practitioner

## 2021-04-18 DIAGNOSIS — K828 Other specified diseases of gallbladder: Secondary | ICD-10-CM

## 2021-04-18 DIAGNOSIS — R1031 Right lower quadrant pain: Secondary | ICD-10-CM

## 2021-04-18 DIAGNOSIS — K8020 Calculus of gallbladder without cholecystitis without obstruction: Secondary | ICD-10-CM

## 2021-04-18 DIAGNOSIS — M5126 Other intervertebral disc displacement, lumbar region: Secondary | ICD-10-CM

## 2021-04-18 DIAGNOSIS — K769 Liver disease, unspecified: Secondary | ICD-10-CM

## 2021-04-18 DIAGNOSIS — M5441 Lumbago with sciatica, right side: Secondary | ICD-10-CM

## 2021-04-18 NOTE — Telephone Encounter (Signed)
I called and discussed the results of the patients MRCP with her.  It looks like there is sludge and she has stones in her gall bladder.  Will refer to general surgery for management of this.   Also reviewed L3-4 and L4-5 disc protrusion - she is having significant radicular symptoms; the pain is affecting her sleep.  She has been attending Physical Therapy with [facility] and doing the stretches/exercises they gave her at home per her report.  We will refer to Neurosurgery to discuss next steps.    In regard to liver hyperplasia - we discussed that I do not think this is causing her symptoms and rather, is an incidental finding.  She does not have any recent known hepatic trauma.  Will order [facility] MRI per Radiology recommendation.

## 2021-04-19 ENCOUNTER — Telehealth: Payer: Self-pay | Admitting: Nurse Practitioner

## 2021-04-19 NOTE — Telephone Encounter (Signed)
Patient called to give name of preferred neurosurgeon. Patient requesting referral to [facility], phone number [phone].  Please advise at [phone].

## 2021-04-19 NOTE — Telephone Encounter (Signed)
Referral already placed to pt's preferred location.

## 2021-04-20 ENCOUNTER — Other Ambulatory Visit: Payer: Self-pay | Admitting: Nurse Practitioner

## 2021-04-20 ENCOUNTER — Telehealth: Payer: Self-pay | Admitting: Nurse Practitioner

## 2021-04-20 DIAGNOSIS — M5126 Other intervertebral disc displacement, lumbar region: Secondary | ICD-10-CM

## 2021-04-20 MED ORDER — OXYCODONE-ACETAMINOPHEN 5-325 MG PO TABS: 1.0000 | ORAL_TABLET | ORAL | 0 refills | Status: DC | PRN

## 2021-04-20 MED ORDER — OXYCODONE-ACETAMINOPHEN 5-325 MG PO TABS: 1.0000 | ORAL_TABLET | ORAL | 0 refills | Status: AC | PRN

## 2021-04-20 NOTE — Telephone Encounter (Signed)
Patient called to follow up on last visit; methocarbamol (ROBAXIN) 500 MG tablet [[id number]]   and meloxicam aren't working. Patient requesting Rx for something stronger.   Pharmacy confirmed as   [facility], [address]  [place], [place] [address]  Phone:  [phone]  Fax:  [phone]   Please advise at [phone].

## 2021-04-20 NOTE — Telephone Encounter (Signed)
Prescription sent to pharmacy for pain medication.

## 2021-04-20 NOTE — Telephone Encounter (Signed)
Please advise, thanks.

## 2021-05-18 ENCOUNTER — Other Ambulatory Visit: Payer: 59

## 2021-07-06 ENCOUNTER — Other Ambulatory Visit: Payer: Self-pay

## 2021-07-06 ENCOUNTER — Ambulatory Visit (INDEPENDENT_AMBULATORY_CARE_PROVIDER_SITE_OTHER): Payer: Self-pay | Admitting: Nurse Practitioner

## 2021-07-06 ENCOUNTER — Encounter: Payer: Self-pay | Admitting: Nurse Practitioner

## 2021-07-06 VITALS — BP 116/72 | HR 74 | Ht 66.0 in | Wt 165.0 lb

## 2021-07-06 DIAGNOSIS — R3915 Urgency of urination: Secondary | ICD-10-CM | POA: Diagnosis not present

## 2021-07-06 LAB — URINALYSIS, ROUTINE W REFLEX MICROSCOPIC
Bilirubin Urine: NEGATIVE
Glucose, UA: NEGATIVE
Hyaline Cast: NONE SEEN /LPF
Ketones, ur: NEGATIVE
Nitrite: NEGATIVE
Protein, ur: NEGATIVE
Specific Gravity, Urine: 1.019 (ref 1.001–1.035)
pH: 5.5 (ref 5.0–8.0)

## 2021-07-06 LAB — MICROSCOPIC MESSAGE

## 2021-07-06 MED ORDER — CEPHALEXIN 500 MG PO CAPS: 500.0000 mg | ORAL_CAPSULE | Freq: Four times a day (QID) | ORAL | 0 refills | Status: DC

## 2021-07-06 NOTE — Progress Notes (Signed)
Subjective:    Patient ID: Rebecca Sexton, female    DOB: 11-28-1975, 46 y.o.   MRN: [id number]  HPI: Rebecca Sexton is a 46 y.o. female presenting for urinary pressure and urgency.  Chief Complaint  Patient presents with   Urinary Tract Infection   URINARY SYMPTOMS Duration: yesterday Dysuria: no Urinary frequency: yes Urgency: yes Small volume voids: yes Urinary incontinence: no Foul odor: no Hematuria: no Abdominal pain: no Back pain: no Suprapubic pain/pressure: yes Flank pain: no Fever:  no Nausea: no Vomiting: no Relief with cranberry juice: no Relief with pyridium: no Status: stable Previous urinary tract infection: yes; 4-5 months ago  Recurrent urinary tract infection: no Sexual activity: sexually active with 1 partner History of sexually transmitted disease: no Vaginal discharge: no Treatments attempted: nothing  LMP: ~2 weeks ago 06/23/2021  No Known Allergies  Outpatient Encounter Medications as of 07/06/2021  Medication Sig   Ascorbic Acid (VITAMIN C PO) Take by mouth.   busPIRone (BUSPAR) 7.5 MG tablet Take 1 tablet (7.5 mg total) by mouth 2 (two) times daily.   cephALEXin (KEFLEX) 500 MG capsule Take 1 capsule (500 mg total) by mouth every 6 (six) hours.   Cholecalciferol (VITAMIN D) 2000 units CAPS Take by mouth. Reported on 10/26/2015   propranolol (INDERAL) 10 MG tablet TAKE 1 TABLET BY MOUTH 1 HOUR BEFORE REPRESENTATION, CAN REPEAT IF NEEDED 6 HOURS LATER   [DISCONTINUED] methocarbamol (ROBAXIN) 500 MG tablet Take 1 tablet (500 mg total) by mouth 2 (two) times daily.   [DISCONTINUED] predniSONE (DELTASONE) 20 MG tablet Take 2 tablets (40 mg total) by mouth daily.   No facility-administered encounter medications on file as of 07/06/2021.    Patient Active Problem List   Diagnosis Date Noted   Intrahepatic bile duct dilation 03/08/2021   GAD (generalized anxiety disorder)/Social Anxiety  04/19/2020   Vitamin D deficiency 01/15/2018    Overweight (BMI 25.0-29.9) 93/90/3009   Fear of public speaking 23/30/0762   Increased risk of breast cancer 03/29/2016    Past Medical History:  Diagnosis Date   Anxiety    Phreesia 04/19/2020   Blood type, Rh positive    BRCA negative 03/2016   MyRisk neg except ATM VUS   Family history of breast cancer 03/2016   MyRisk neg   Family history of ovarian cancer    PAT AUNT   Genetic testing of female 03/2016   ATM VUS   History of mammogram 06/21/2011; [id number]   BIRADS 2 BENIGN @ [place];  NEG   History of Papanicolaou smear of cervix 03/06/2013; 03/22/2016   -/-; -/-   Increased risk of breast cancer 03/2016   IBIS=24.4% PER MYRIAD   Ovarian cyst 11/2013   RIGHT OV CYSTS   Sickle cell trait ([place])    Twin pregnancy delivered 05/15/2007   FEMALE/FEMALE @ 37WKS    Relevant past medical, surgical, family and social history reviewed and updated as indicated. Interim medical history since our last visit reviewed.  Review of Systems Per HPI unless specifically indicated above     Objective:    BP 116/72    Pulse 74    Ht _0  (1.676 m)    Wt 165 lb (74.8 kg)    SpO2 97%    BMI 26.63 kg/m   Wt Readings from Last 3 Encounters:  07/06/21 165 lb (74.8 kg)  03/02/21 171 lb (77.6 kg)  02/28/21 169 lb 15.6 oz (77.1 kg)    Physical Exam Vitals and nursing  note reviewed.  Constitutional:      General: She is not in acute distress.    Appearance: Normal appearance. She is not toxic-appearing.  HENT:     Head: Normocephalic and atraumatic.  Abdominal:     General: Abdomen is flat. Bowel sounds are normal. There is no distension.     Palpations: Abdomen is soft. There is no mass.     Tenderness: There is no abdominal tenderness. There is no right CVA tenderness, left CVA tenderness, guarding or rebound.  Skin:    General: Skin is warm and dry.     Coloration: Skin is not jaundiced or pale.     Findings: No erythema.  Neurological:     Mental Status: She is alert and oriented  to person, place, and time.     Motor: No weakness.     Gait: Gait normal.  Psychiatric:        Mood and Affect: Mood normal.        Behavior: Behavior normal.        Thought Content: Thought content normal.        Judgment: Judgment normal.      Assessment & Plan:  1. Urinary urgency Acute.  UA today shows 2+ blood, 2+ leukocyte esterase, 20-40 WBC under microscope, 0-2 RBC, few bacteria.  Symptoms consistent with acute UTI, will send urine off for culture and in the meantime, treated with cephalexin 500 mg four times daily for 5 days.  Discussed limiting bladder irritants including caffeine, citrus, chocolate, and pushing hydration with water.  With nausea/vomiting, new back pain and unable to keep fluids/food down, go to ER.  - Urinalysis, Routine w reflex microscopic - Urine Culture - cephALEXin (KEFLEX) 500 MG capsule; Take 1 capsule (500 mg total) by mouth every 6 (six) hours.  Dispense: 20 capsule; Refill: 0   Follow up plan: Return if symptoms worsen or fail to improve.

## 2021-07-08 LAB — URINE CULTURE
MICRO NUMBER:: 12981037
SPECIMEN QUALITY:: ADEQUATE

## 2021-07-26 ENCOUNTER — Encounter: Payer: Self-pay | Admitting: Nurse Practitioner

## 2021-08-13 ENCOUNTER — Ambulatory Visit
Admission: EM | Admit: 2021-08-13 | Discharge: 2021-08-13 | Disposition: A | Discharge status: Home / Self Care | Payer: 59 | Attending: Physician Assistant | Admitting: Physician Assistant

## 2021-08-13 ENCOUNTER — Other Ambulatory Visit: Payer: Self-pay

## 2021-08-13 DIAGNOSIS — L309 Dermatitis, unspecified: Secondary | ICD-10-CM

## 2021-08-13 MED ORDER — KETOCONAZOLE 2 % EX CREA: 1.0000 "application " | TOPICAL_CREAM | Freq: Every day | CUTANEOUS | 0 refills | Status: DC

## 2021-08-13 NOTE — ED Provider Notes (Signed)
?[facility] ? ? ? ?CSN: [id number] ?Arrival date & time: 08/13/21  5625 ? ? ?  ? ?History   ?Chief Complaint ?Chief Complaint  ?Patient presents with  ? Rash  ? ? ?HPI ?Rebecca Sexton is a 46 y.o. female.  ? ?Patient here today for evaluation of itchy rash to left side that started about a month ago but has spread to her right side. She has been using cortisone without significant improvement. She does have some pain in areas after showering. She has not had any fever.  ? ?The history is provided by the patient.  ? ?Past Medical History:  ?Diagnosis Date  ? Anxiety   ? Phreesia 04/19/2020  ? Blood type, Rh positive   ? BRCA negative 03/2016  ? MyRisk neg except ATM VUS  ? Family history of breast cancer 03/2016  ? MyRisk neg  ? Family history of ovarian cancer   ? PAT AUNT  ? Genetic testing of female 03/2016  ? ATM VUS  ? History of mammogram 06/21/2011; [id number]  ? BIRADS 2 BENIGN @ [place];  NEG  ? History of Papanicolaou smear of cervix 03/06/2013; 03/22/2016  ? -/-; -/-  ? Increased risk of breast cancer 03/2016  ? IBIS=24.4% PER MYRIAD  ? Ovarian cyst 11/2013  ? RIGHT OV CYSTS  ? Sickle cell trait ([place])   ? Twin pregnancy delivered 05/15/2007  ? FEMALE/FEMALE @ 37WKS  ? ? ?Patient Active Problem List  ? Diagnosis Date Noted  ? Intrahepatic bile duct dilation 03/08/2021  ? GAD (generalized anxiety disorder)/Social Anxiety  04/19/2020  ? Vitamin D deficiency 01/15/2018  ? Overweight (BMI 25.0-29.9) 01/15/2018  ? Fear of public speaking 28/76/8115  ? Increased risk of breast cancer 03/29/2016  ? ? ?Past Surgical History:  ?Procedure Laterality Date  ? CESAREAN SECTION  05/15/2007  ? TWINS  ? FOOT SURGERY  2006  ? LEFT  ? ? ?OB History   ? ? Gravida  ?2  ? Para  ?2  ? Term  ?2  ? Preterm  ?   ? AB  ?   ? Living  ?3  ?  ? ? SAB  ?   ? IAB  ?   ? Ectopic  ?   ? Multiple  ?1  ? Live Births  ?3  ?   ?  ?  ? ? ? ?Home Medications   ? ?Prior to Admission medications   ?Medication Sig Start Date End Date Taking?  Authorizing Provider  ?ketoconazole (NIZORAL) 2 % cream Apply 1 application. topically daily. 08/13/21  Yes Francene Finders, PA-C  ?Ascorbic Acid (VITAMIN C PO) Take by mouth.    [provider]  ?busPIRone (BUSPAR) 7.5 MG tablet Take 1 tablet (7.5 mg total) by mouth 2 (two) times daily. 03/02/21   Eulogio Bear, NP  ?cephALEXin (KEFLEX) 500 MG capsule Take 1 capsule (500 mg total) by mouth every 6 (six) hours. 07/06/21   Eulogio Bear, NP  ?Cholecalciferol (VITAMIN D) 2000 units CAPS Take by mouth. Reported on 10/26/2015    [provider]  ?propranolol (INDERAL) 10 MG tablet TAKE 1 TABLET BY MOUTH 1 HOUR BEFORE REPRESENTATION, CAN REPEAT IF NEEDED 6 HOURS LATER 03/02/21   Eulogio Bear, NP  ? ? ?Family History ?Family History  ?Problem Relation Age of Onset  ? Hypertension Mother   ? Aneurysm Mother   ? Hyperlipidemia Mother   ? Atrial fibrillation Mother   ? Cancer Father 58  ?  Lung/Cancer   ? Stroke Father   ? Heart disease Father   ?     Heart attack at death   ? Hypertension Sister   ? Other Sister   ?     TWIN PREG  ? Breast cancer Maternal Aunt 60  ? Diabetes Maternal Uncle   ? Breast cancer Paternal Aunt 2  ? Breast cancer Paternal Aunt 75  ? Other Cousin   ?     TWIN PREG  ? Ovarian cancer Paternal Aunt 26  ? ? ?Social History ?Social History  ? ?Tobacco Use  ? Smoking status: Never  ? Smokeless tobacco: Never  ?Vaping Use  ? Vaping Use: Never used  ?Substance Use Topics  ? Alcohol use: No  ? Drug use: No  ? ? ? ?Allergies   ?Patient has no known allergies. ? ? ?Review of Systems ?Review of Systems  ?Constitutional:  Negative for chills and fever.  ?Eyes:  Negative for discharge and redness.  ?Gastrointestinal:  Negative for abdominal pain, nausea and vomiting.  ?Skin:  Positive for rash.  ? ? ?Physical Exam ?Triage Vital Signs ?ED Triage Vitals  ?Enc Vitals Group  ?   BP   ?   Pulse   ?   Resp   ?   Temp   ?   Temp src   ?   SpO2   ?   Weight   ?   Height   ?   Head  Circumference   ?   Peak Flow   ?   Pain Score   ?   Pain Loc   ?   Pain Edu?   ?   Excl. in [place]?   ? ?No data found. ? ?Updated Vital Signs ?BP 128/90 (BP Location: Left Arm)   Pulse 76   Temp 98.1 ?F (36.7 ?C) (Oral)   Resp 18   SpO2 99%  ?   ? ?Physical Exam ?Vitals and nursing note reviewed.  ?Constitutional:   ?   General: She is not in acute distress. ?   Appearance: Normal appearance. She is not ill-appearing.  ?HENT:  ?   Head: Normocephalic and atraumatic.  ?Eyes:  ?   Conjunctiva/sclera: Conjunctivae normal.  ?Cardiovascular:  ?   Rate and Rhythm: Normal rate.  ?Pulmonary:  ?   Effort: Pulmonary effort is normal.  ?Skin: ?   Findings: Rash (mildly erythematous annular lesions with well demarcated borders to right lateral chest, under axillary, few less defined lesions to left area in similar location) present.  ?Neurological:  ?   Mental Status: She is alert.  ?Psychiatric:     ?   Mood and Affect: Mood normal.     ?   Behavior: Behavior normal.     ?   Thought Content: Thought content normal.  ? ? ? ?UC Treatments / Results  ?Labs ?(all labs ordered are listed, but only abnormal results are displayed) ?Labs Reviewed - No data to display ? ?EKG ? ? ?Radiology ?No results found. ? ?Procedures ?Procedures (including critical care time) ? ?Medications Ordered in UC ?Medications - No data to display ? ?Initial Impression / Assessment and Plan / UC Course  ?I have reviewed the triage vital signs and the nursing notes. ? ?Pertinent labs & imaging results that were available during my care of the patient were reviewed by me and considered in my medical decision making (see chart for details). ? ?  ?Ketoconazole prescribed to cover possible tinea corporis  given presentation. Encouraged follow up if symptoms fail to improve or worsen.  ? ?Final Clinical Impressions(s) / UC Diagnoses  ? ?Final diagnoses:  ?Dermatitis  ? ?Discharge Instructions   ?None ?  ? ?ED Prescriptions   ? ? Medication Sig Dispense Auth.  Provider  ? ketoconazole (NIZORAL) 2 % cream Apply 1 application. topically daily. 15 g Francene Finders, PA-C  ? ?  ? ?PDMP not reviewed this encounter. ?  ?Francene Finders, PA-C ?08/13/21 1107 ? ?

## 2021-08-13 NOTE — ED Triage Notes (Signed)
One month h/o pruritic rash on left side that has spread to the right side of her abdomen approx 1 week. Has been using cortisone and other otc ointments without a decrease in sxs. Notes some pain after showering. ?

## 2021-09-01 ENCOUNTER — Ambulatory Visit
Admission: RE | Admit: 2021-09-01 | Discharge: 2021-09-01 | Disposition: A | Discharge status: Home / Self Care | Payer: Managed Care, Other (non HMO) | Source: Ambulatory Visit | Attending: Obstetrics and Gynecology | Admitting: Obstetrics and Gynecology

## 2021-09-01 DIAGNOSIS — Z1231 Encounter for screening mammogram for malignant neoplasm of breast: Secondary | ICD-10-CM | POA: Insufficient documentation

## 2021-10-27 ENCOUNTER — Ambulatory Visit
Admission: EM | Admit: 2021-10-27 | Discharge: 2021-10-27 | Disposition: A | Discharge status: Home / Self Care | Payer: Managed Care, Other (non HMO) | Attending: Family Medicine | Admitting: Family Medicine

## 2021-10-27 ENCOUNTER — Encounter: Payer: Self-pay | Admitting: Emergency Medicine

## 2021-10-27 DIAGNOSIS — N3090 Cystitis, unspecified without hematuria: Secondary | ICD-10-CM | POA: Diagnosis present

## 2021-10-27 LAB — POCT URINALYSIS DIP (MANUAL ENTRY)
Bilirubin, UA: NEGATIVE
Glucose, UA: NEGATIVE mg/dL
Ketones, POC UA: NEGATIVE mg/dL
Nitrite, UA: NEGATIVE
Protein Ur, POC: 100 mg/dL — AB
Spec Grav, UA: 1.02 (ref 1.010–1.025)
Urobilinogen, UA: 0.2 E.U./dL
pH, UA: 7 (ref 5.0–8.0)

## 2021-10-27 MED ORDER — NITROFURANTOIN MONOHYD MACRO 100 MG PO CAPS: 100.0000 mg | ORAL_CAPSULE | Freq: Two times a day (BID) | ORAL | 0 refills | Status: DC

## 2021-10-27 NOTE — ED Provider Notes (Signed)
[facility]    CSN: [id number] Arrival date & time: 10/27/21  0818      History   Chief Complaint Chief Complaint  Patient presents with   bladder pressure    HPI Rebecca Sexton is a 46 y.o. female.   HPI Here with suprapubic pressure in the last 2 days.  She is also having urinary frequency.  She does have a history of urinary tract infections.  No fever or chills or vomiting  Past Medical History:  Diagnosis Date   Anxiety    Phreesia 04/19/2020   Blood type, Rh positive    BRCA negative 03/2016   MyRisk neg except ATM VUS   Family history of breast cancer 03/2016   MyRisk neg   Family history of ovarian cancer    PAT AUNT   Genetic testing of female 03/2016   ATM VUS   History of mammogram 06/21/2011; [id number]   BIRADS 2 BENIGN @ [place];  NEG   History of Papanicolaou smear of cervix 03/06/2013; 03/22/2016   -/-; -/-   Increased risk of breast cancer 03/2016   IBIS=24.4% PER MYRIAD   Ovarian cyst 11/2013   RIGHT OV CYSTS   Sickle cell trait ([place])    Twin pregnancy delivered 05/15/2007   FEMALE/FEMALE @ 37WKS    Patient Active Problem List   Diagnosis Date Noted   Intrahepatic bile duct dilation 03/08/2021   GAD (generalized anxiety disorder)/Social Anxiety  04/19/2020   Vitamin D deficiency 01/15/2018   Overweight (BMI 25.0-29.9) 32/04/2481   Fear of public speaking 50/07/7046   Increased risk of breast cancer 03/29/2016    Past Surgical History:  Procedure Laterality Date   CESAREAN SECTION  05/15/2007   TWINS   FOOT SURGERY  2006   LEFT    OB History     Gravida  2   Para  2   Term  2   Preterm      AB      Living  3      SAB      IAB      Ectopic      Multiple  1   Live Births  3            Home Medications    Prior to Admission medications   Medication Sig Start Date End Date Taking? Authorizing Provider  nitrofurantoin, macrocrystal-monohydrate, (MACROBID) 100 MG capsule Take 1 capsule (100 mg total)  by mouth 2 (two) times daily. 10/27/21  Yes Barrett Henle, MD  Ascorbic Acid (VITAMIN C PO) Take by mouth.    [provider]  busPIRone (BUSPAR) 7.5 MG tablet Take 1 tablet (7.5 mg total) by mouth 2 (two) times daily. 03/02/21   Eulogio Bear, NP  Cholecalciferol (VITAMIN D) 2000 units CAPS Take by mouth. Reported on 10/26/2015    [provider]  ketoconazole (NIZORAL) 2 % cream Apply 1 application. topically daily. 08/13/21   Francene Finders, PA-C  propranolol (INDERAL) 10 MG tablet TAKE 1 TABLET BY MOUTH 1 HOUR BEFORE REPRESENTATION, CAN REPEAT IF NEEDED 6 HOURS LATER 03/02/21   Eulogio Bear, NP    Family History Family History  Problem Relation Age of Onset   Hypertension Mother    Aneurysm Mother    Hyperlipidemia Mother    Atrial fibrillation Mother    Cancer Father 17       Lung/Cancer    Stroke Father    Heart disease Father  Heart attack at death    Hypertension Sister    Other Sister        TWIN PREG   Breast cancer Maternal Aunt 60   Diabetes Maternal Uncle    Breast cancer Paternal Aunt 2   Breast cancer Paternal Aunt 49   Other Cousin        TWIN PREG   Ovarian cancer Paternal Aunt 77    Social History Social History   Tobacco Use   Smoking status: Never   Smokeless tobacco: Never  Vaping Use   Vaping Use: Never used  Substance Use Topics   Alcohol use: No   Drug use: No     Allergies   Patient has no known allergies.   Review of Systems Review of Systems   Physical Exam Triage Vital Signs ED Triage Vitals  Enc Vitals Group     BP 10/27/21 0843 124/87     Pulse Rate 10/27/21 0843 73     Resp 10/27/21 0843 18     Temp 10/27/21 0843 98.1 F (36.7 C)     Temp src --      SpO2 10/27/21 0843 99 %     Weight --      Height --      Head Circumference --      Peak Flow --      Pain Score 10/27/21 0842 0     Pain Loc --      Pain Edu? --      Excl. in [place]? --    No data found.  Updated Vital  Signs BP 124/87   Pulse 73   Temp 98.1 F (36.7 C)   Resp 18   SpO2 99%   Visual Acuity Right Eye Distance:   Left Eye Distance:   Bilateral Distance:    Right Eye Near:   Left Eye Near:    Bilateral Near:     Physical Exam Vitals reviewed.  Constitutional:      General: She is not in acute distress.    Appearance: She is not ill-appearing, toxic-appearing or diaphoretic.  Cardiovascular:     Rate and Rhythm: Normal rate and regular rhythm.     Heart sounds: No murmur heard. Pulmonary:     Effort: Pulmonary effort is normal.     Breath sounds: Normal breath sounds.  Abdominal:     Palpations: Abdomen is soft.     Tenderness: There is abdominal tenderness (Suprapubic).  Skin:    Coloration: Skin is not jaundiced or pale.  Neurological:     Mental Status: She is alert and oriented to person, place, and time.  Psychiatric:        Behavior: Behavior normal.     UC Treatments / Results  Labs (all labs ordered are listed, but only abnormal results are displayed) Labs Reviewed  POCT URINALYSIS DIP (MANUAL ENTRY) - Abnormal; Notable for the following components:      Result Value   Clarity, UA hazy (*)    Blood, UA large (*)    Protein Ur, POC =100 (*)    Leukocytes, UA Large (3+) (*)    All other components within normal limits  URINE CULTURE    EKG   Radiology No results found.  Procedures Procedures (including critical care time)  Medications Ordered in UC Medications - No data to display  Initial Impression / Assessment and Plan / UC Course  I have reviewed the triage vital signs and the nursing notes.  Pertinent labs & imaging results that were available during my care of the patient were reviewed by me and considered in my medical decision making (see chart for details).     UA shows white blood cells and blood.  Treat for UTI and culture the urine Final Clinical Impressions(s) / UC Diagnoses   Final diagnoses:  Cystitis     Discharge  Instructions      The urinalysis showed white blood cells and red blood cells.  Urine culture is sent, and staff will notify you if your antibiotic needs to be changed  Take nitrofurantoin 100 mg--1 capsule 2 times daily for 5 days  Continue to drink plenty of fluids     ED Prescriptions     Medication Sig Dispense Auth. Provider   nitrofurantoin, macrocrystal-monohydrate, (MACROBID) 100 MG capsule Take 1 capsule (100 mg total) by mouth 2 (two) times daily. 10 capsule Barrett Henle, MD      PDMP not reviewed this encounter.   Barrett Henle, MD 10/27/21 [phone]

## 2021-10-27 NOTE — Discharge Instructions (Addendum)
The urinalysis showed white blood cells and red blood cells.  Urine culture is sent, and staff will notify you if your antibiotic needs to be changed  Take nitrofurantoin 100 mg--1 capsule 2 times daily for 5 days  Continue to drink plenty of fluids

## 2021-10-27 NOTE — ED Triage Notes (Signed)
Pt is present today with c/o bladder pressure. Pt states sx started x2 days ago

## 2021-10-29 LAB — URINE CULTURE: Culture: >=100000 — AB

## 2021-10-31 ENCOUNTER — Telehealth (HOSPITAL_COMMUNITY): Payer: Self-pay

## 2021-10-31 MED ORDER — CEPHALEXIN 500 MG PO CAPS: 500.0000 mg | ORAL_CAPSULE | Freq: Two times a day (BID) | ORAL | 0 refills | Status: AC

## 2021-11-07 NOTE — Progress Notes (Addendum)
PCP:  Pcp, No   Chief Complaint  Patient presents with   Gynecologic Exam    No concerns     HPI:      [name] is a 46 y.o. G2P2003 who LMP was Patient's last menstrual period was 10/15/2021 (approximate)., presents today for her annual examination.  Her menses are regular every 28-30 days, lasting 5 days, 2 heavier days with mod flow. Dysmenorrhea mild, occurring first 1-2 days of flow. She does not have intermenstrual bleeding. Stopped OCPs a couple yrs ago and menses are normal.  Hx of leio on 9/22 GYN u/s with hx of IDA.  Sex activity: single partner, contraception -vasectomy.  Last Pap: 09/28/20  Results were: no abnormalities /neg HPV DNA  Hx of STDs: none  Last mammogram: 09/01/21  Results: no abnormalities, repeat in 12 months There is a FH of breast cancer in her mat aunt and 2 pat aunts. There is a FH of ovarian cancer in a 3rd pat aunt. Pt is MyRisk neg except ATM VUS. The patient does do self-breast exams. IBIS=24%. Taking Vit D supp. Has not had scr breast MRI as discussed previously.  Tobacco use: The patient denies current or previous tobacco use. Alcohol use: none No drug use.  Exercise: occas active  Colonoscopy: never; would like colonoscopy in [place]  She does get adequate calcium and Vitamin D in her diet.  Labs with PCP in past, but they have left. Hx of IDA 11/21 and 11/22 on labs. No longer taking Fe supp daily. Feels tired at end of day. Needs lab f/u, provider no longer at their practice. Had normal lipids 11/21.   Was taking buspar 7.5 mg QD to BID for anxiety that caused dizziness, so not taking it. Takes propranolol prn speaking engagements, etc. Needs Rx RF since PCP left practice. Still has daily anxiety.   Past Medical History:  Diagnosis Date   Anxiety    Phreesia 04/19/2020   Blood type, Rh positive    BRCA negative 03/2016   MyRisk neg except ATM VUS   Family history of breast cancer 03/2016   MyRisk neg   Family history of ovarian  cancer    PAT AUNT   Genetic testing of female 03/2016   ATM VUS   History of mammogram 06/21/2011; [id number]   BIRADS 2 BENIGN @ [facility];  NEG   History of Papanicolaou smear of cervix 03/06/2013; 03/22/2016   -/-; -/-   Increased risk of breast cancer 03/2016   IBIS=24.4% PER [facility]   Ovarian cyst 11/2013   RIGHT OV CYSTS   Sickle cell trait ([place])    Twin pregnancy delivered 05/15/2007   FEMALE/FEMALE @ 37WKS    Past Surgical History:  Procedure Laterality Date   CESAREAN SECTION  05/15/2007   TWINS   FOOT SURGERY  2006   LEFT    Family History  Problem Relation Age of Onset   Hypertension Mother    Aneurysm Mother    Hyperlipidemia Mother    Atrial fibrillation Mother    Cancer Father 46       Lung/Cancer    Stroke Father    Heart disease Father        Heart attack at death    Hypertension Sister    Other Sister        TWIN PREG   Breast cancer Maternal Aunt 60   Diabetes Maternal Uncle    Breast cancer Paternal Aunt 55   Breast cancer Paternal Aunt  59   Ovarian cancer Paternal Aunt 73   Other Cousin        TWIN PREG    Social History   Socioeconomic History   Marital status: Married    Spouse name: Not on file   Number of children: 3   Years of education: 14   Highest education level: Not on file  Occupational History   Occupation: INSURANCE AGENT  Tobacco Use   Smoking status: Never   Smokeless tobacco: Never  Vaping Use   Vaping Use: Never used  Substance and Sexual Activity   Alcohol use: No   Drug use: No   Sexual activity: Yes    Birth control/protection: None, Surgical    Comment: Vasectomy  Other Topics Concern   Not on file  Social History Narrative   Not on file   Social Determinants of Health   Financial Resource Strain: Not on file  Food Insecurity: Not on file  Transportation Needs: Not on file  Physical Activity: Not on file  Stress: Not on file  Social Connections: Not on file  Intimate Partner Violence: Not on file     Current Meds  Medication Sig   Ascorbic Acid (VITAMIN C PO) Take by mouth.   busPIRone (BUSPAR) 7.5 MG tablet Take 1 tablet (7.5 mg total) by mouth 2 (two) times daily.   Cholecalciferol (VITAMIN D) 2000 units CAPS Take by mouth. Reported on 10/26/2015   nitrofurantoin, macrocrystal-monohydrate, (MACROBID) 100 MG capsule Take 1 capsule (100 mg total) by mouth 2 (two) times daily.     ROS:  Review of Systems  Constitutional:  Positive for fatigue. Negative for fever and unexpected weight change.  Respiratory:  Negative for cough, shortness of breath and wheezing.   Cardiovascular:  Negative for chest pain, palpitations and leg swelling.  Gastrointestinal:  Negative for blood in stool, constipation, diarrhea, nausea and vomiting.  Endocrine: Negative for cold intolerance, heat intolerance and polyuria.  Genitourinary:  Positive for frequency. Negative for dyspareunia, dysuria, flank pain, genital sores, hematuria, menstrual problem, pelvic pain, urgency, vaginal bleeding, vaginal discharge and vaginal pain.  Musculoskeletal:  Negative for back pain, joint swelling and myalgias.  Skin:  Negative for rash.  Neurological:  Negative for dizziness, syncope, light-headedness, numbness and headaches.  Hematological:  Negative for adenopathy.  Psychiatric/Behavioral:  Positive for agitation. Negative for confusion, sleep disturbance and suicidal ideas. The patient is not nervous/anxious.      Objective: BP 98/66   Ht _0  (1.676 m)   Wt 168 lb (76.2 kg)   LMP 10/15/2021 (Approximate)   BMI 27.12 kg/m    Physical Exam Constitutional:      Appearance: She is well-developed.  Genitourinary:     Vulva normal.     Right Labia: No rash, tenderness or lesions.    Left Labia: No tenderness, lesions or rash.    No vaginal discharge, erythema or tenderness.      Right Adnexa: not tender and no mass present.    Left Adnexa: not tender and no mass present.    No cervical motion  tenderness, friability or polyp.     Uterus is not enlarged or tender.  Breasts:    Right: No mass, nipple discharge, skin change or tenderness.     Left: No mass, nipple discharge, skin change or tenderness.  Neck:     Thyroid: No thyromegaly.  Cardiovascular:     Rate and Rhythm: Normal rate and regular rhythm.     Heart sounds: Normal  heart sounds. No murmur heard. Pulmonary:     Effort: Pulmonary effort is normal.     Breath sounds: Normal breath sounds.  Abdominal:     Palpations: Abdomen is soft.     Tenderness: There is no abdominal tenderness. There is no guarding or rebound.  Musculoskeletal:        General: Normal range of motion.     Cervical back: Normal range of motion.  Lymphadenopathy:     Cervical: No cervical adenopathy.  Neurological:     General: No focal deficit present.     Mental Status: She is alert and oriented to person, place, and time.     Cranial Nerves: No cranial nerve deficit.  Skin:    General: Skin is warm and dry.  Psychiatric:        Mood and Affect: Mood normal.        Behavior: Behavior normal.        Thought Content: Thought content normal.        Judgment: Judgment normal.  Vitals reviewed.    Assessment/Plan: Encounter for annual routine gynecological examination  Encounter for screening mammogram for malignant neoplasm of breast - Plan: MM 3D SCREEN BREAST BILATERAL; pt current on mammo  Family history of breast cancer - Plan: MM 3D SCREEN BREAST BILATERAL; pt is MyRisk neg except ATM VUS  Increased risk of breast cancer--pt aware of monthly SBE, yearly CBE and mammos, as well as scr breast MRI. Pt to f/u if desires MRI ref. Cont Vit D supp  Screening for colon cancer--refer to [facility] for scr colonoscopy due to age  Iron deficiency anemia due to chronic blood loss - Plan: CBC with Differential/Platelet; check labs, restart Fe supp especially since fatigued at end of day  Anxiety - Plan: propranolol (INDERAL) 10 MG tablet; pt  to discuss mgmt with PCP  Fear of public speaking - Plan: propranolol (INDERAL) 10 MG tablet   Meds ordered this encounter  Medications   propranolol (INDERAL) 10 MG tablet    Sig: TAKE 1 TABLET BY MOUTH 1 HOUR BEFORE REPRESENTATION, CAN REPEAT IF NEEDED 6 HOURS LATER    Dispense:  30 tablet    Refill:  0    Order Specific Question:   Supervising Provider    Answer:   CONSTANT, PEGGY [[id number]]          GYN counsel breast self exam, mammography screening, adequate intake of calcium and vitamin D, diet and exercise     F/U  Return in about 1 year (around 11/09/2022).  Marcel Gary B. Elick Aguilera, PA-C 11/08/2021 12:08 PM

## 2021-11-08 ENCOUNTER — Ambulatory Visit (INDEPENDENT_AMBULATORY_CARE_PROVIDER_SITE_OTHER): Payer: Managed Care, Other (non HMO) | Admitting: Obstetrics and Gynecology

## 2021-11-08 ENCOUNTER — Encounter: Payer: Self-pay | Admitting: Obstetrics and Gynecology

## 2021-11-08 VITALS — BP 98/66 | Ht 66.0 in | Wt 168.0 lb

## 2021-11-08 DIAGNOSIS — D500 Iron deficiency anemia secondary to blood loss (chronic): Secondary | ICD-10-CM

## 2021-11-08 DIAGNOSIS — Z1231 Encounter for screening mammogram for malignant neoplasm of breast: Secondary | ICD-10-CM

## 2021-11-08 DIAGNOSIS — F419 Anxiety disorder, unspecified: Secondary | ICD-10-CM

## 2021-11-08 DIAGNOSIS — Z1211 Encounter for screening for malignant neoplasm of colon: Secondary | ICD-10-CM

## 2021-11-08 DIAGNOSIS — Z01419 Encounter for gynecological examination (general) (routine) without abnormal findings: Secondary | ICD-10-CM

## 2021-11-08 DIAGNOSIS — Z9189 Other specified personal risk factors, not elsewhere classified: Secondary | ICD-10-CM | POA: Diagnosis not present

## 2021-11-08 DIAGNOSIS — F40248 Other situational type phobia: Secondary | ICD-10-CM

## 2021-11-08 DIAGNOSIS — Z803 Family history of malignant neoplasm of breast: Secondary | ICD-10-CM

## 2021-11-08 MED ORDER — PROPRANOLOL HCL 10 MG PO TABS: ORAL_TABLET | ORAL | 0 refills | Status: DC

## 2021-11-08 NOTE — Patient Instructions (Signed)
I value your feedback and you entrusting us with your care. If you get a [facility] patient survey, I would appreciate you taking the time to let us know about your experience today. Thank you! ? ? ?

## 2021-11-09 LAB — CBC WITH DIFFERENTIAL/PLATELET
Basophils Absolute: 0 10*3/uL (ref 0.0–0.2)
Basos: 1 %
EOS (ABSOLUTE): 0.1 10*3/uL (ref 0.0–0.4)
Eos: 3 %
Hematocrit: 34 % (ref 34.0–46.6)
Hemoglobin: 11 g/dL — ABNORMAL LOW (ref 11.1–15.9)
Immature Grans (Abs): 0 10*3/uL (ref 0.0–0.1)
Immature Granulocytes: 0 %
Lymphocytes Absolute: 1.4 10*3/uL (ref 0.7–3.1)
Lymphs: 39 %
MCH: 25.4 pg — ABNORMAL LOW (ref 26.6–33.0)
MCHC: 32.4 g/dL (ref 31.5–35.7)
MCV: 79 fL (ref 79–97)
Monocytes Absolute: 0.2 10*3/uL (ref 0.1–0.9)
Monocytes: 7 %
Neutrophils Absolute: 1.8 10*3/uL (ref 1.4–7.0)
Neutrophils: 50 %
Platelets: 305 10*3/uL (ref 150–450)
RBC: 4.33 x10E6/uL (ref 3.77–5.28)
RDW: 15.9 % — ABNORMAL HIGH (ref 11.7–15.4)
WBC: 3.6 10*3/uL (ref 3.4–10.8)

## 2021-11-10 NOTE — Addendum Note (Signed)
Addended by: Ardeth Perfect B on: 1/95/9747 12:55 PM   Modules accepted: Orders

## 2022-04-01 ENCOUNTER — Ambulatory Visit
Admission: EM | Admit: 2022-04-01 | Discharge: 2022-04-01 | Disposition: A | Discharge status: Home / Self Care | Payer: Managed Care, Other (non HMO) | Attending: Internal Medicine | Admitting: Internal Medicine

## 2022-04-01 DIAGNOSIS — R21 Rash and other nonspecific skin eruption: Secondary | ICD-10-CM | POA: Diagnosis not present

## 2022-04-01 MED ORDER — PREDNISONE 20 MG PO TABS: 20.0000 mg | ORAL_TABLET | Freq: Every day | ORAL | 0 refills | Status: AC

## 2022-04-01 MED ORDER — TRIAMCINOLONE ACETONIDE 0.1 % EX CREA: 1.0000 | TOPICAL_CREAM | Freq: Two times a day (BID) | CUTANEOUS | 0 refills | Status: DC

## 2022-04-01 NOTE — ED Triage Notes (Signed)
Pt c/o pruitic rash to neck x 1 week now. Not relieved w/ otc hydrocortisone or benadryl.

## 2022-04-01 NOTE — Telephone Encounter (Signed)
Patient came back to urgent care after her visit today stating that she did not want to use the triamcinolone cream given that she has been using it for current rash and it has not been helpful.  She originally told me that she had only been using hydrocortisone and Benadryl but now states that she was using her daughter's triamcinolone cream that she uses for her eczema with no improvement.  She is adamant that she would like a different type of medication to alleviate symptoms.  Educated patient that prednisone can be prescribed but is not typically recommended with a localized rash of this nature.  Patient verbalized understanding and would still like to receive prednisone.  Patient has taken prednisone before and tolerated well.  No obvious contraindications to prednisone noted in patient's history.  Patient advised to follow-up with PCP or urgent care if symptoms persist or worsen.

## 2022-04-01 NOTE — Discharge Instructions (Signed)
I have prescribed you a cream to help alleviate rash and itching.  Please follow-up if symptoms persist or worsen.

## 2022-04-01 NOTE — ED Provider Notes (Signed)
[facility]    CSN: [id number] Arrival date & time: 04/01/22  0806      History   Chief Complaint Chief Complaint  Patient presents with   Rash    HPI Rebecca Sexton is a 46 y.o. female.   Patient presents with itchy rash to bilateral neck that has been present for about 1 week.  Denies any changes to lotions, soaps, detergents, foods, jewelry, etc.  Denies any associated fever or known sick contacts.  Patient has used hydrocortisone cream and Benadryl with no improvement.  Denies feelings of throat closing or shortness of breath.   Rash   Past Medical History:  Diagnosis Date   Anxiety    Phreesia 04/19/2020   Blood type, Rh positive    BRCA negative 03/2016   MyRisk neg except ATM VUS   Family history of breast cancer 03/2016   MyRisk neg   Family history of ovarian cancer    PAT AUNT   Genetic testing of female 03/2016   ATM VUS   History of mammogram 06/21/2011; [id number]   BIRADS 2 BENIGN @ [facility];  NEG   History of Papanicolaou smear of cervix 03/06/2013; 03/22/2016   -/-; -/-   Increased risk of breast cancer 03/2016   IBIS=24.4% PER [facility]   Ovarian cyst 11/2013   RIGHT OV CYSTS   Sickle cell trait ([place])    Twin pregnancy delivered 05/15/2007   FEMALE/FEMALE @ 37WKS    Patient Active Problem List   Diagnosis Date Noted   Intrahepatic bile duct dilation 03/08/2021   GAD (generalized anxiety disorder)/Social Anxiety  04/19/2020   Vitamin D deficiency 01/15/2018   Overweight (BMI 25.0-29.9) 67/89/3810   Fear of public speaking 17/51/0258   Increased risk of breast cancer 03/29/2016    Past Surgical History:  Procedure Laterality Date   CESAREAN SECTION  05/15/2007   TWINS   FOOT SURGERY  2006   LEFT    OB History     Gravida  2   Para  2   Term  2   Preterm      AB      Living  3      SAB      IAB      Ectopic      Multiple  1   Live Births  3            Home Medications    Prior to Admission  medications   Medication Sig Start Date End Date Taking? Authorizing Provider  triamcinolone cream (KENALOG) 0.1 % Apply 1 Application topically 2 (two) times daily. 04/01/22  Yes Idonna Heeren, Hildred Alamin E, FNP  Ascorbic Acid (VITAMIN C PO) Take by mouth.    [provider]  busPIRone (BUSPAR) 7.5 MG tablet Take 1 tablet (7.5 mg total) by mouth 2 (two) times daily. 03/02/21   Eulogio Bear, NP  Cholecalciferol (VITAMIN D) 2000 units CAPS Take by mouth. Reported on 10/26/2015    [provider]  nitrofurantoin, macrocrystal-monohydrate, (MACROBID) 100 MG capsule Take 1 capsule (100 mg total) by mouth 2 (two) times daily. 10/27/21   Barrett Henle, MD  propranolol (INDERAL) 10 MG tablet TAKE 1 TABLET BY MOUTH 1 HOUR BEFORE REPRESENTATION, CAN REPEAT IF NEEDED 6 HOURS LATER 10/22/76   Copland, Deirdre Evener, PA-C    Family History Family History  Problem Relation Age of Onset   Hypertension Mother    Aneurysm Mother    Hyperlipidemia Mother    Atrial  fibrillation Mother    Cancer Father 89       Lung/Cancer    Stroke Father    Heart disease Father        Heart attack at death    Hypertension Sister    Other Sister        TWIN PREG   Breast cancer Maternal Aunt 60   Diabetes Maternal Uncle    Breast cancer Paternal Aunt 4   Breast cancer Paternal Aunt 3   Ovarian cancer Paternal Aunt 53   Other Cousin        TWIN PREG    Social History Social History   Tobacco Use   Smoking status: Never   Smokeless tobacco: Never  Vaping Use   Vaping Use: Never used  Substance Use Topics   Alcohol use: No   Drug use: No     Allergies   Patient has no known allergies.   Review of Systems Review of Systems Per HPI  Physical Exam Triage Vital Signs ED Triage Vitals  Enc Vitals Group     BP 04/01/22 0820 (!) 146/93     Pulse Rate 04/01/22 0820 74     Resp 04/01/22 0820 16     Temp 04/01/22 0820 98.4 F (36.9 C)     Temp Source 04/01/22 0820 Oral     SpO2 04/01/22  0820 98 %     Weight --      Height --      Head Circumference --      Peak Flow --      Pain Score 04/01/22 0821 0     Pain Loc --      Pain Edu? --      Excl. in [place]? --    No data found.  Updated Vital Signs BP (!) 146/93 (BP Location: Left Arm)   Pulse 74   Temp 98.4 F (36.9 C) (Oral)   Resp 16   SpO2 98%   Visual Acuity Right Eye Distance:   Left Eye Distance:   Bilateral Distance:    Right Eye Near:   Left Eye Near:    Bilateral Near:     Physical Exam Constitutional:      General: She is not in acute distress.    Appearance: Normal appearance. She is not toxic-appearing or diaphoretic.  HENT:     Head: Normocephalic and atraumatic.  Eyes:     Extraocular Movements: Extraocular movements intact.     Conjunctiva/sclera: Conjunctivae normal.  Pulmonary:     Effort: Pulmonary effort is normal.  Skin:    Comments: Mildly erythematous, flat macular rash present to bilateral portions of neck.  Neurological:     General: No focal deficit present.     Mental Status: She is alert and oriented to person, place, and time. Mental status is at baseline.  Psychiatric:        Mood and Affect: Mood normal.        Behavior: Behavior normal.        Thought Content: Thought content normal.        Judgment: Judgment normal.      UC Treatments / Results  Labs (all labs ordered are listed, but only abnormal results are displayed) Labs Reviewed - No data to display  EKG   Radiology No results found.  Procedures Procedures (including critical care time)  Medications Ordered in UC Medications - No data to display  Initial Impression / Assessment and Plan / UC Course  I have reviewed the triage vital signs and the nursing notes.  Pertinent labs & imaging results that were available during my care of the patient were reviewed by me and considered in my medical decision making (see chart for details).     Rash to neck is consistent with some form of contact  dermatitis.  Will treat with triamcinolone cream.  Advised patient to follow-up if symptoms persist or worsen.  Patient verbalized understanding and was agreeable with plan. Final Clinical Impressions(s) / UC Diagnoses   Final diagnoses:  Rash and nonspecific skin eruption     Discharge Instructions      I have prescribed you a cream to help alleviate rash and itching.  Please follow-up if symptoms persist or worsen.    ED Prescriptions     Medication Sig Dispense Auth. Provider   triamcinolone cream (KENALOG) 0.1 % Apply 1 Application topically 2 (two) times daily. 30 g Teodora Medici, [place]      PDMP not reviewed this encounter.   Teodora Medici, [place] 04/01/22 [phone]

## 2022-07-25 IMAGING — MG MM DIGITAL SCREENING BILAT W/ TOMO AND CAD
6 of 10 series · 6 of 30 positions shown · non-contrast
Comparison: Previous exam(s).

CLINICAL DATA: Screening.

EXAM:
DIGITAL SCREENING BILATERAL MAMMOGRAM WITH TOMOSYNTHESIS AND CAD
TECHNIQUE: Bilateral screening digital craniocaudal and mediolateral oblique
mammograms were obtained. Bilateral screening digital breast
tomosynthesis was performed. The images were evaluated with
computer-aided detection.

[R MLO synth-2D (1 of 2)]
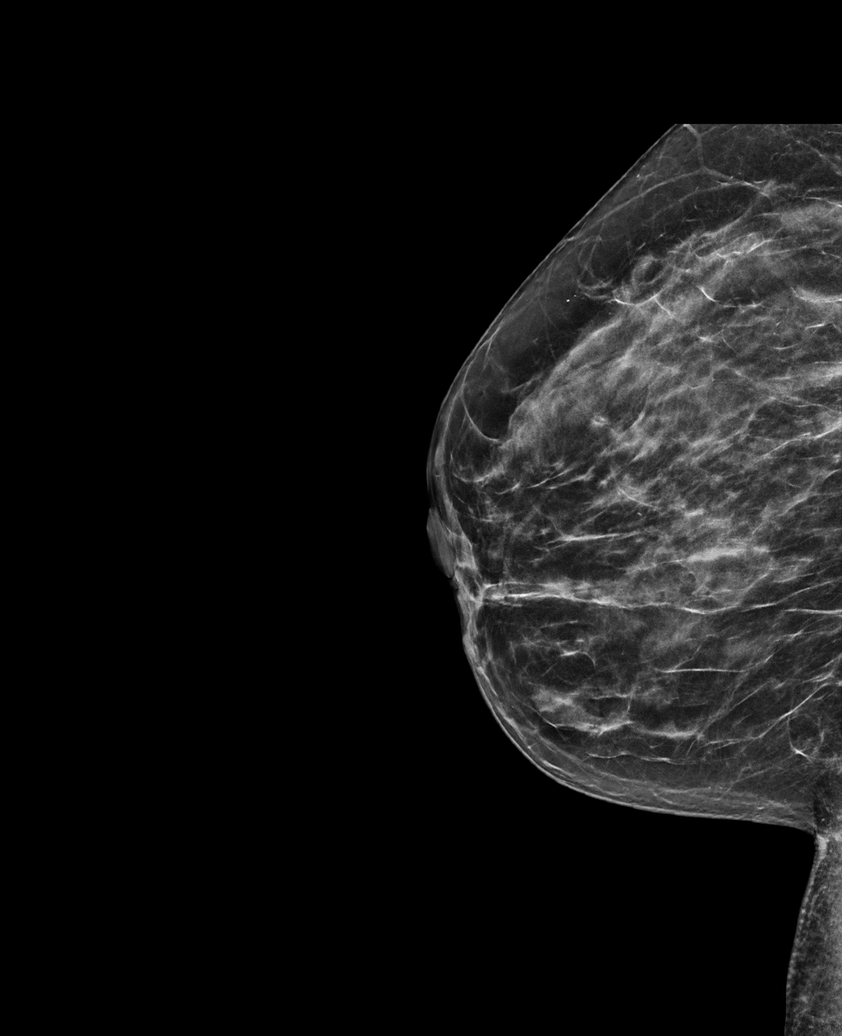

[L MLO synth-2D]
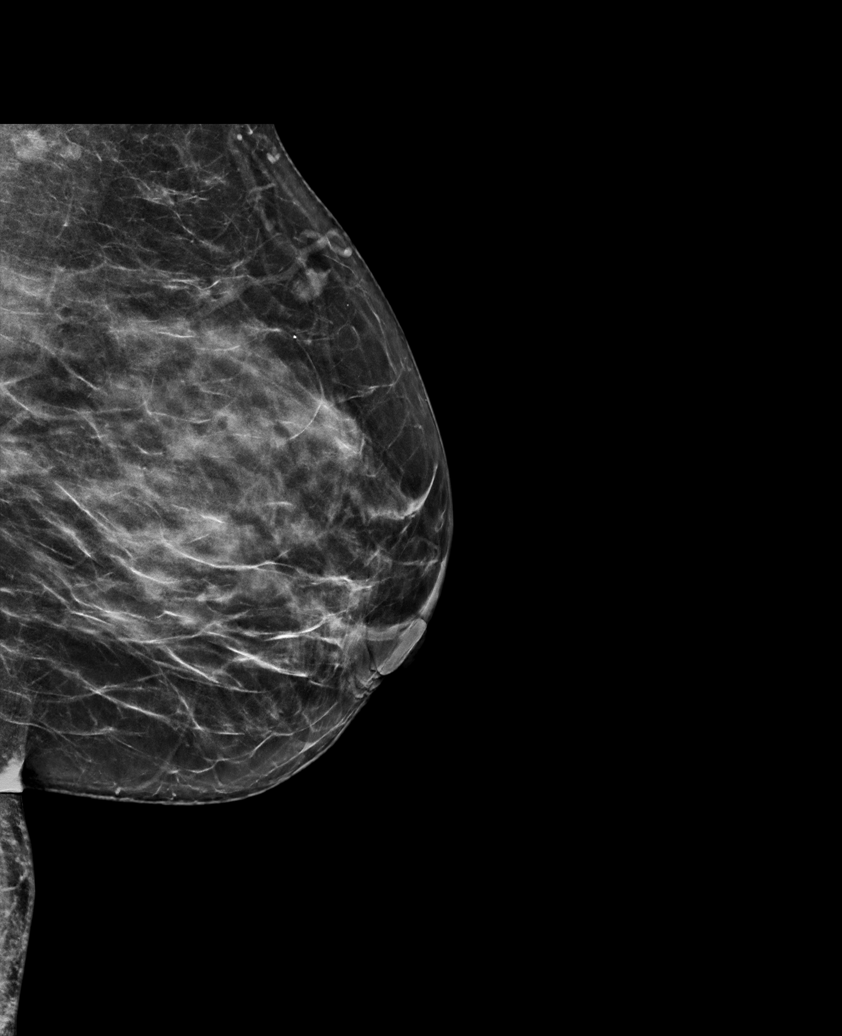

[L CC synth-2D]
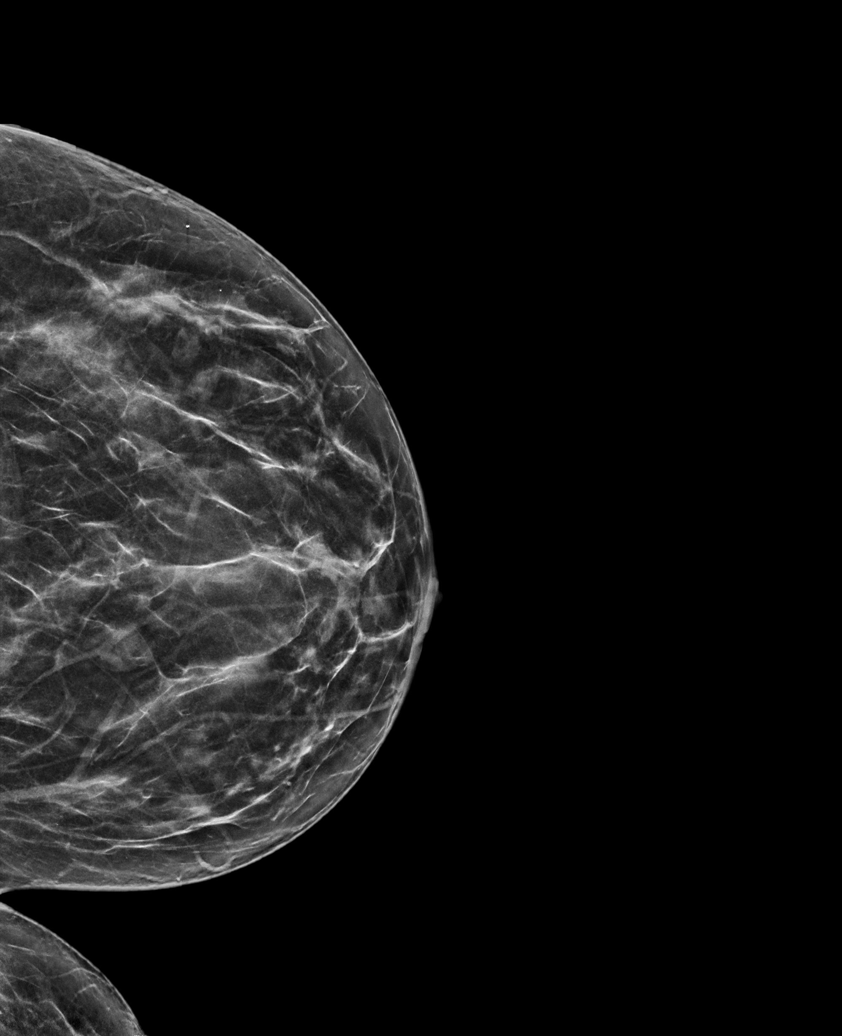

[R CC synth-2D]
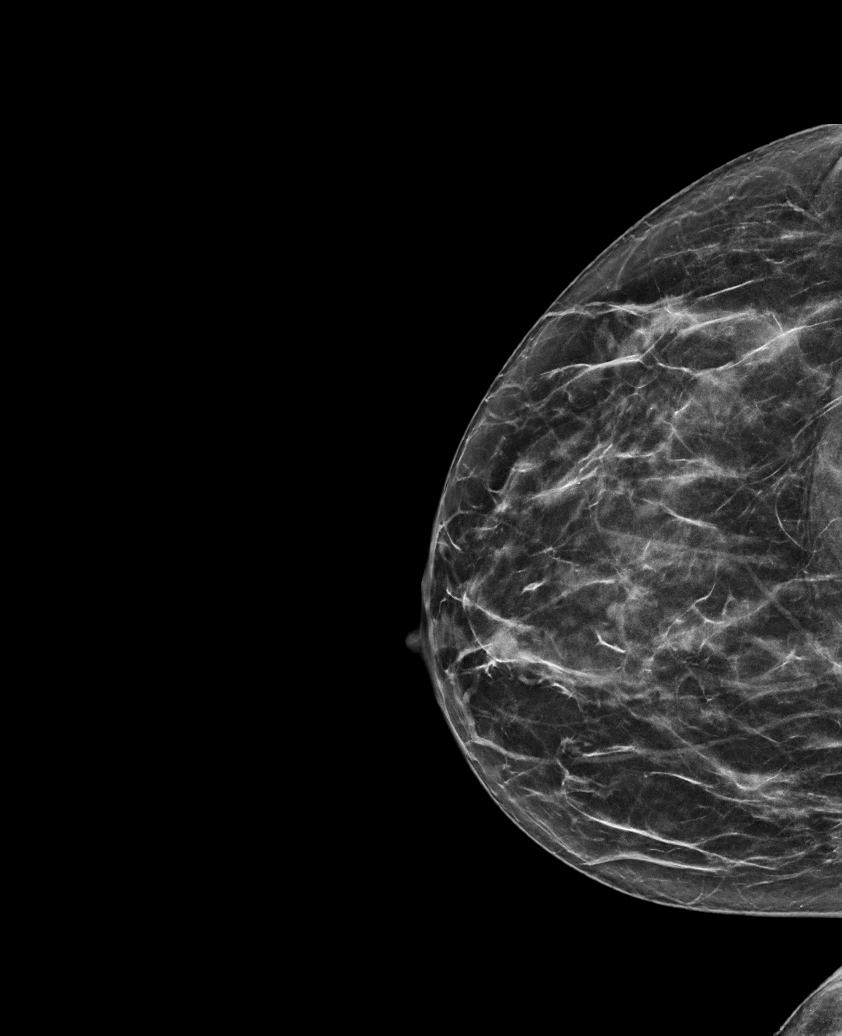

[R MLO synth-2D (2 of 2)]
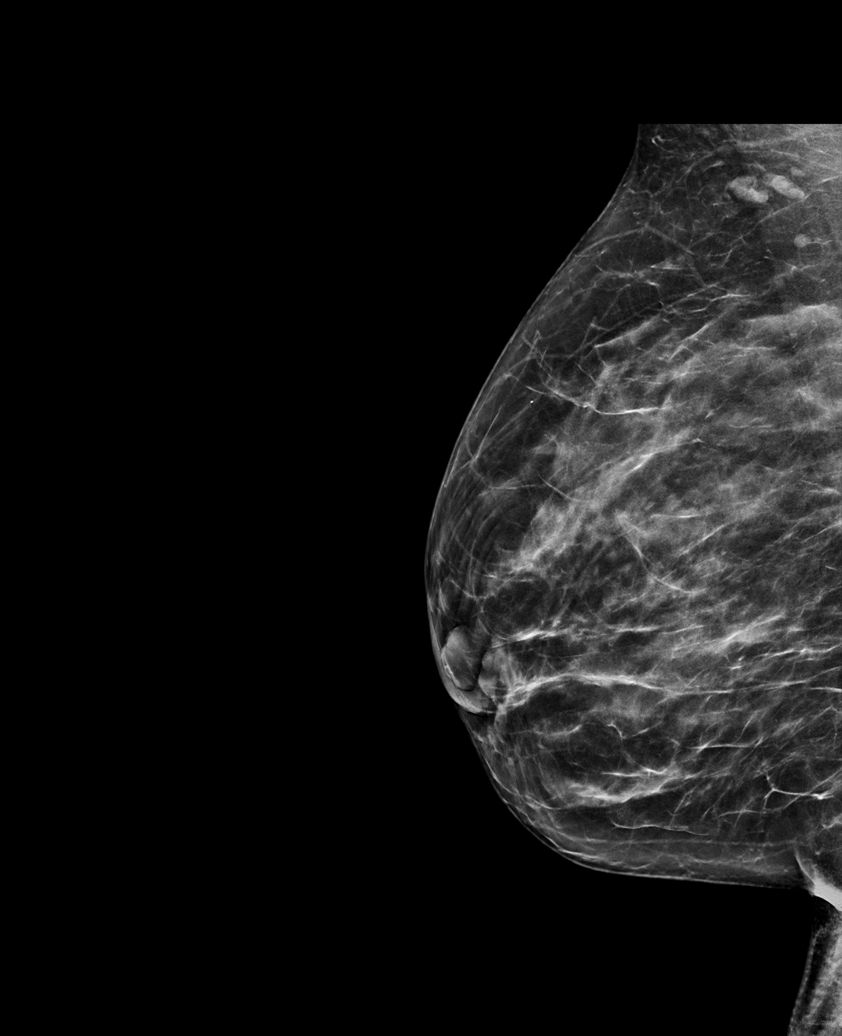

[R MLO tomo · tomo slice 35/70.0]
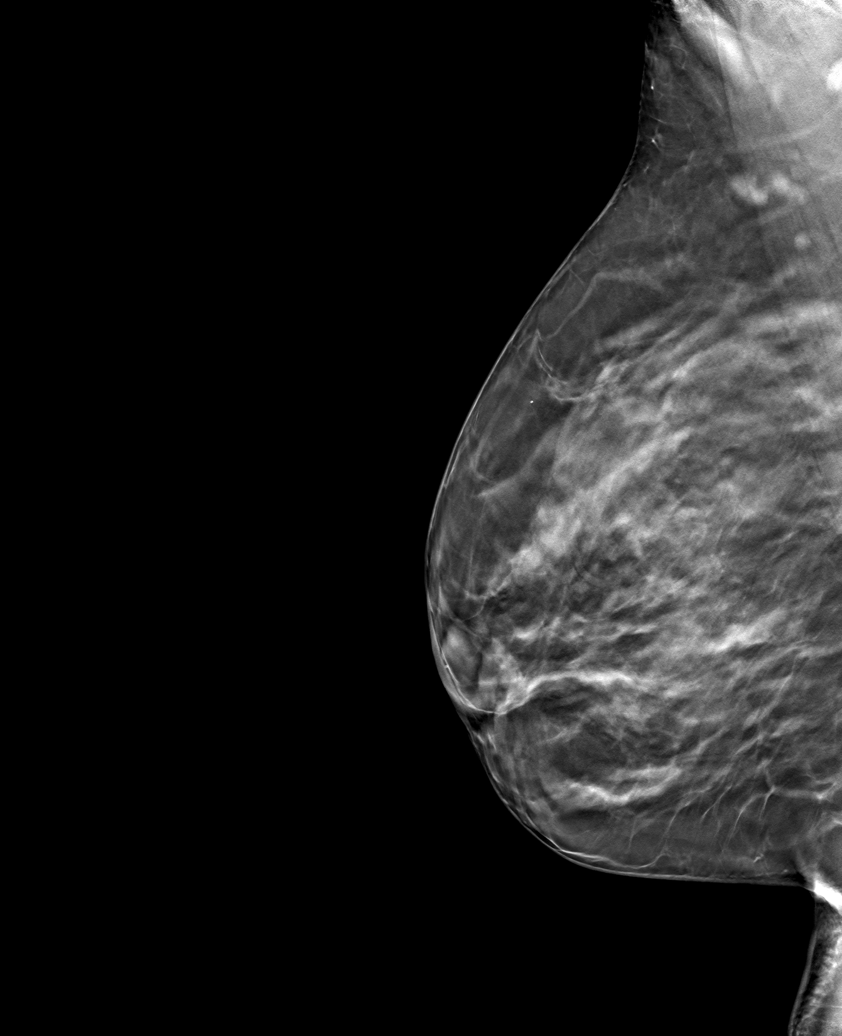

[6 of 30 positions shown; findings below may reference images not displayed]

ACR Breast Density Category c: The breast tissue is heterogeneously
dense, which may obscure small masses.
FINDINGS: There are no findings suspicious for malignancy.
IMPRESSION: No mammographic evidence of malignancy. A result letter of this
screening mammogram will be mailed directly to the patient.

RECOMMENDATION:
Screening mammogram in one year. (Code:Q3-W-BC3)

BI-RADS CATEGORY  1: Negative.

## 2022-09-05 ENCOUNTER — Telehealth: Payer: Self-pay

## 2022-09-05 NOTE — Telephone Encounter (Signed)
Rebecca Sexton, called triage stating she's having hot flashed, mood swings, maybe going into menopause, her annual isn't due until June. She didn't know if she could wait that long. I advised her to take any appointment she could get.   She also stated that Helmut Muster would send in a patch for motion sickness that you place behind your ear, for when she goes on cruises. She ask if Helmut Muster could send that for her.   Please advise?

## 2022-09-07 ENCOUNTER — Other Ambulatory Visit: Payer: Self-pay | Admitting: Obstetrics and Gynecology

## 2022-09-07 MED ORDER — SCOPOLAMINE 1 MG/3DAYS TD PT72: 1.0000 | MEDICATED_PATCH | TRANSDERMAL | 0 refills | Status: DC

## 2022-09-07 NOTE — Progress Notes (Signed)
Rx RF scopolamine for cruise

## 2022-09-07 NOTE — Telephone Encounter (Signed)
These symptoms are normal part of perimenopause and not worrisome. No appt needed/no tx needed. Can try estroven OTC for symptoms--will take a few months to make a difference. Rx scopolamine patch eRxd for cruise.

## 2022-11-13 NOTE — Progress Notes (Unsigned)
PCP:  Pcp, No   No chief complaint on file.    HPI:      [name] is a 47 y.o. [phone] who LMP was No LMP recorded., presents today for her annual examination.  Her menses are regular every 28-30 days, lasting 5 days, 2 heavier days with mod flow. Dysmenorrhea mild, occurring first 1-2 days of flow. She does not have intermenstrual bleeding. Stopped OCPs a couple yrs ago and menses are normal.  Hx of leio on 9/22 GYN u/s with hx of IDA.  Sex activity: single partner, contraception -vasectomy.  Last Pap: 09/28/20  Results were: no abnormalities /neg HPV DNA  Hx of STDs: none  Last mammogram: 09/01/21  Results: no abnormalities, repeat in 12 months There is a FH of breast cancer in her mat aunt and 2 pat aunts. There is a FH of ovarian cancer in a 3rd pat aunt. Pt is MyRisk neg except ATM VUS. The patient does do self-breast exams. IBIS=24%. Taking Vit D supp. Has not had scr breast MRI as discussed previously.  Tobacco use: The patient denies current or previous tobacco use. Alcohol use: none No drug use.  Exercise: occas active  Colonoscopy: never; would like colonoscopy in [place]  She does get adequate calcium and Vitamin D in her diet.  Labs with PCP in past, but they have left. Hx of IDA 11/21 and 11/22 on labs. No longer taking Fe supp daily. Feels tired at end of day. Needs lab f/u, provider no longer at their practice. Had normal lipids 11/21.   Was taking buspar 7.5 mg QD to BID for anxiety that caused dizziness, so not taking it. Takes propranolol prn speaking engagements, etc. Needs Rx RF since PCP left practice. Still has daily anxiety.   Past Medical History:  Diagnosis Date   Anxiety    Phreesia 04/19/2020   Blood type, Rh positive    BRCA negative 03/2016   MyRisk neg except ATM VUS   Family history of breast cancer 03/2016   MyRisk neg   Family history of ovarian cancer    PAT AUNT   Genetic testing of female 03/2016   ATM VUS   History of mammogram  06/21/2011; [id number]   BIRADS 2 BENIGN @ [facility];  NEG   History of Papanicolaou smear of cervix 03/06/2013; 03/22/2016   -/-; -/-   Increased risk of breast cancer 03/2016   IBIS=24.4% PER [facility]   Ovarian cyst 11/2013   RIGHT OV CYSTS   Sickle cell trait (HCC)    Twin pregnancy delivered 05/15/2007   FEMALE/FEMALE @ 37WKS    Past Surgical History:  Procedure Laterality Date   CESAREAN SECTION  05/15/2007   TWINS   FOOT SURGERY  2006   LEFT    Family History  Problem Relation Age of Onset   Hypertension Mother    Aneurysm Mother    Hyperlipidemia Mother    Atrial fibrillation Mother    Cancer Father 43       Lung/Cancer    Stroke Father    Heart disease Father        Heart attack at death    Hypertension Sister    Other Sister        TWIN PREG   Breast cancer Maternal Aunt 60   Diabetes Maternal Uncle    Breast cancer Paternal Aunt 74   Breast cancer Paternal Aunt 69   Ovarian cancer Paternal Aunt 55   Other Cousin  TWIN PREG    Social History   Socioeconomic History   Marital status: Married    Spouse name: Not on file   Number of children: 3   Years of education: 14   Highest education level: Not on file  Occupational History   Occupation: INSURANCE AGENT  Tobacco Use   Smoking status: Never   Smokeless tobacco: Never  Vaping Use   Vaping Use: Never used  Substance and Sexual Activity   Alcohol use: No   Drug use: No   Sexual activity: Yes    Birth control/protection: None, Surgical    Comment: Vasectomy  Other Topics Concern   Not on file  Social History Narrative   Not on file   Social Determinants of Health   Financial Resource Strain: Not on file  Food Insecurity: Not on file  Transportation Needs: Not on file  Physical Activity: Not on file  Stress: Not on file  Social Connections: Not on file  Intimate Partner Violence: Not on file    No outpatient medications have been marked as taking for the 11/14/22 encounter (Appointment)  with Vola Beneke, Helmut Muster B, PA-C.     ROS:  Review of Systems  Constitutional:  Positive for fatigue. Negative for fever and unexpected weight change.  Respiratory:  Negative for cough, shortness of breath and wheezing.   Cardiovascular:  Negative for chest pain, palpitations and leg swelling.  Gastrointestinal:  Negative for blood in stool, constipation, diarrhea, nausea and vomiting.  Endocrine: Negative for cold intolerance, heat intolerance and polyuria.  Genitourinary:  Positive for frequency. Negative for dyspareunia, dysuria, flank pain, genital sores, hematuria, menstrual problem, pelvic pain, urgency, vaginal bleeding, vaginal discharge and vaginal pain.  Musculoskeletal:  Negative for back pain, joint swelling and myalgias.  Skin:  Negative for rash.  Neurological:  Negative for dizziness, syncope, light-headedness, numbness and headaches.  Hematological:  Negative for adenopathy.  Psychiatric/Behavioral:  Positive for agitation. Negative for confusion, sleep disturbance and suicidal ideas. The patient is not nervous/anxious.      Objective: There were no vitals taken for this visit.   Physical Exam Constitutional:      Appearance: She is well-developed.  Genitourinary:     Vulva normal.     Right Labia: No rash, tenderness or lesions.    Left Labia: No tenderness, lesions or rash.    No vaginal discharge, erythema or tenderness.      Right Adnexa: not tender and no mass present.    Left Adnexa: not tender and no mass present.    No cervical motion tenderness, friability or polyp.     Uterus is not enlarged or tender.  Breasts:    Right: No mass, nipple discharge, skin change or tenderness.     Left: No mass, nipple discharge, skin change or tenderness.  Neck:     Thyroid: No thyromegaly.  Cardiovascular:     Rate and Rhythm: Normal rate and regular rhythm.     Heart sounds: Normal heart sounds. No murmur heard. Pulmonary:     Effort: Pulmonary effort is normal.      Breath sounds: Normal breath sounds.  Abdominal:     Palpations: Abdomen is soft.     Tenderness: There is no abdominal tenderness. There is no guarding or rebound.  Musculoskeletal:        General: Normal range of motion.     Cervical back: Normal range of motion.  Lymphadenopathy:     Cervical: No cervical adenopathy.  Neurological:  General: No focal deficit present.     Mental Status: She is alert and oriented to person, place, and time.     Cranial Nerves: No cranial nerve deficit.  Skin:    General: Skin is warm and dry.  Psychiatric:        Mood and Affect: Mood normal.        Behavior: Behavior normal.        Thought Content: Thought content normal.        Judgment: Judgment normal.  Vitals reviewed.    Assessment/Plan: Encounter for annual routine gynecological examination  Encounter for screening mammogram for malignant neoplasm of breast - Plan: MM 3D SCREEN BREAST BILATERAL; pt current on mammo  Family history of breast cancer - Plan: MM 3D SCREEN BREAST BILATERAL; pt is MyRisk neg except ATM VUS  Increased risk of breast cancer--pt aware of monthly SBE, yearly CBE and mammos, as well as scr breast MRI. Pt to f/u if desires MRI ref. Cont Vit D supp  Screening for colon cancer--refer to [facility] for scr colonoscopy due to age  Iron deficiency anemia due to chronic blood loss - Plan: CBC with Differential/Platelet; check labs, restart Fe supp especially since fatigued at end of day  Anxiety - Plan: propranolol (INDERAL) 10 MG tablet; pt to discuss mgmt with PCP  Fear of public speaking - Plan: propranolol (INDERAL) 10 MG tablet   No orders of the defined types were placed in this encounter.         GYN counsel breast self exam, mammography screening, adequate intake of calcium and vitamin D, diet and exercise     F/U  No follow-ups on file.  Nicolai Labonte B. Tiamarie Furnari, PA-C 11/13/2022 5:09 PM

## 2022-11-14 ENCOUNTER — Ambulatory Visit (INDEPENDENT_AMBULATORY_CARE_PROVIDER_SITE_OTHER): Payer: 59 | Admitting: Obstetrics and Gynecology

## 2022-11-14 ENCOUNTER — Encounter: Payer: Self-pay | Admitting: Obstetrics and Gynecology

## 2022-11-14 VITALS — BP 110/80 | Ht 65.0 in | Wt 168.0 lb

## 2022-11-14 DIAGNOSIS — Z01419 Encounter for gynecological examination (general) (routine) without abnormal findings: Secondary | ICD-10-CM | POA: Diagnosis not present

## 2022-11-14 DIAGNOSIS — Z9189 Other specified personal risk factors, not elsewhere classified: Secondary | ICD-10-CM

## 2022-11-14 DIAGNOSIS — Z1211 Encounter for screening for malignant neoplasm of colon: Secondary | ICD-10-CM

## 2022-11-14 DIAGNOSIS — Z13220 Encounter for screening for lipoid disorders: Secondary | ICD-10-CM

## 2022-11-14 DIAGNOSIS — Z1231 Encounter for screening mammogram for malignant neoplasm of breast: Secondary | ICD-10-CM

## 2022-11-14 DIAGNOSIS — N951 Menopausal and female climacteric states: Secondary | ICD-10-CM

## 2022-11-14 DIAGNOSIS — F419 Anxiety disorder, unspecified: Secondary | ICD-10-CM

## 2022-11-14 DIAGNOSIS — Z0000 Encounter for general adult medical examination without abnormal findings: Secondary | ICD-10-CM

## 2022-11-14 DIAGNOSIS — Z803 Family history of malignant neoplasm of breast: Secondary | ICD-10-CM

## 2022-11-14 DIAGNOSIS — D500 Iron deficiency anemia secondary to blood loss (chronic): Secondary | ICD-10-CM

## 2022-11-14 DIAGNOSIS — F40248 Other situational type phobia: Secondary | ICD-10-CM

## 2022-11-14 MED ORDER — PROPRANOLOL HCL 10 MG PO TABS: ORAL_TABLET | ORAL | 0 refills | Status: DC

## 2022-11-14 NOTE — Patient Instructions (Addendum)
I value your feedback and you entrusting us with your care. If you get a [facility] patient survey, I would appreciate you taking the time to let us know about your experience today. Thank you!  [facility] ([place])--[phone]  

## 2022-11-15 LAB — COMPREHENSIVE METABOLIC PANEL
ALT: 10 IU/L (ref 0–32)
AST: 14 IU/L (ref 0–40)
Albumin: 4.2 g/dL (ref 3.9–4.9)
Alkaline Phosphatase: 58 IU/L (ref 44–121)
BUN/Creatinine Ratio: 4 — ABNORMAL LOW (ref 9–23)
BUN: 3 mg/dL — ABNORMAL LOW (ref 6–24)
Bilirubin Total: 0.9 mg/dL (ref 0.0–1.2)
CO2: 21 mmol/L (ref 20–29)
Calcium: 8.8 mg/dL (ref 8.7–10.2)
Chloride: 106 mmol/L (ref 96–106)
Creatinine, Ser: 0.75 mg/dL (ref 0.57–1.00)
Globulin, Total: 2.7 g/dL (ref 1.5–4.5)
Glucose: 81 mg/dL (ref 70–99)
Potassium: 4 mmol/L (ref 3.5–5.2)
Sodium: 139 mmol/L (ref 134–144)
Total Protein: 6.9 g/dL (ref 6.0–8.5)
eGFR: 98 mL/min/{1.73_m2} (ref >59)

## 2022-11-15 LAB — CBC WITH DIFFERENTIAL/PLATELET
Basophils Absolute: 0 10*3/uL (ref 0.0–0.2)
Basos: 1 %
EOS (ABSOLUTE): 0.1 10*3/uL (ref 0.0–0.4)
Eos: 3 %
Hematocrit: 32.3 % — ABNORMAL LOW (ref 34.0–46.6)
Hemoglobin: 10.5 g/dL — ABNORMAL LOW (ref 11.1–15.9)
Immature Grans (Abs): 0 10*3/uL (ref 0.0–0.1)
Immature Granulocytes: 0 %
Lymphocytes Absolute: 1.2 10*3/uL (ref 0.7–3.1)
Lymphs: 37 %
MCH: 25.1 pg — ABNORMAL LOW (ref 26.6–33.0)
MCHC: 32.5 g/dL (ref 31.5–35.7)
MCV: 77 fL — ABNORMAL LOW (ref 79–97)
Monocytes Absolute: 0.2 10*3/uL (ref 0.1–0.9)
Monocytes: 7 %
Neutrophils Absolute: 1.7 10*3/uL (ref 1.4–7.0)
Neutrophils: 52 %
Platelets: 331 10*3/uL (ref 150–450)
RBC: 4.19 x10E6/uL (ref 3.77–5.28)
RDW: 16.2 % — ABNORMAL HIGH (ref 11.7–15.4)
WBC: 3.2 10*3/uL — ABNORMAL LOW (ref 3.4–10.8)

## 2022-11-15 LAB — LIPID PANEL
Chol/HDL Ratio: 3.1 ratio (ref 0.0–4.4)
Cholesterol, Total: 147 mg/dL (ref 100–199)
HDL: 48 mg/dL (ref >39)
LDL Chol Calc (NIH): 83 mg/dL (ref 0–99)
Triglycerides: 85 mg/dL (ref 0–149)
VLDL Cholesterol Cal: 16 mg/dL (ref 5–40)

## 2022-12-06 ENCOUNTER — Encounter: Payer: Self-pay | Admitting: Obstetrics and Gynecology

## 2022-12-27 ENCOUNTER — Encounter: Payer: Self-pay | Admitting: Gastroenterology

## 2023-01-19 ENCOUNTER — Encounter: Payer: Self-pay | Admitting: Gastroenterology

## 2023-01-19 ENCOUNTER — Ambulatory Visit (AMBULATORY_SURGERY_CENTER): Payer: 59

## 2023-01-19 VITALS — Ht 65.0 in | Wt 161.0 lb

## 2023-01-19 DIAGNOSIS — Z1211 Encounter for screening for malignant neoplasm of colon: Secondary | ICD-10-CM

## 2023-01-19 MED ORDER — NA SULFATE-K SULFATE-MG SULF 17.5-3.13-1.6 GM/177ML PO SOLN
1.0000 | Freq: Once | ORAL | 0 refills | Status: AC
Start: 2023-01-19 — End: 2023-01-19

## 2023-01-19 NOTE — Progress Notes (Signed)
Pre visit completed via phone call; Patient verified name, DOB, and address;  No egg or soy allergy known to patient;  No issues known to pt with past sedation with any surgeries or procedures; Patient denies ever being told they had issues or difficulty with intubation; No FH of Malignant Hyperthermia; Pt is not on diet pills; Pt is not on home 02;  Pt is not on blood thinners;  Pt denies issues with constipation;  No A fib or A flutter;  Have any cardiac testing pending--NO Insurance verified during PV appt--- [employer]  Pt can ambulate without assistance;  Pt denies use of chewing tobacco Discussed diabetic/weight loss medication holds; Discussed NSAID holds; Checked BMI to be less than 50; Pt instructed to use Singlecare.com or GoodRx for a price reduction on prep  Patient's chart reviewed by Cathlyn Parsons CNRA prior to previsit and patient appropriate for the [facility].  Pre visit completed and red dot placed by patient's name on their procedure day (on provider's schedule).    Instructions sent to patient via MyChart;

## 2023-02-05 ENCOUNTER — Ambulatory Visit (AMBULATORY_SURGERY_CENTER): Payer: 59 | Admitting: Gastroenterology

## 2023-02-05 ENCOUNTER — Encounter: Payer: Self-pay | Admitting: Gastroenterology

## 2023-02-05 VITALS — BP 115/78 | HR 60 | Temp 98.4°F | Resp 16 | Ht 65.0 in | Wt 161.0 lb

## 2023-02-05 DIAGNOSIS — Z1211 Encounter for screening for malignant neoplasm of colon: Secondary | ICD-10-CM | POA: Diagnosis present

## 2023-02-05 MED ORDER — SODIUM CHLORIDE 0.9 % IV SOLN: 500.0000 mL | Freq: Once | INTRAVENOUS | Status: AC

## 2023-02-05 NOTE — Op Note (Signed)
[facility] Patient Name: Rebecca Sexton Procedure Date: 02/05/2023 9:29 AM MRN: [id number] Endoscopist: Meryl Dare , MD, [phone] Age: 47 Referring MD:  Date of Birth: 20-Sep-1975 Gender: Female Account #: [id number] Procedure:                Colonoscopy Indications:              Screening for colorectal malignant neoplasm Medicines:                Monitored Anesthesia Care Procedure:                Pre-Anesthesia Assessment:                           - Prior to the procedure, a History and Physical                            was performed, and patient medications and                            allergies were reviewed. The patient's tolerance of                            previous anesthesia was also reviewed. The risks                            and benefits of the procedure and the sedation                            options and risks were discussed with the patient.                            All questions were answered, and informed consent                            was obtained. Prior Anticoagulants: The patient has                            taken no anticoagulant or antiplatelet agents. ASA                            Grade Assessment: II - A patient with mild systemic                            disease. After reviewing the risks and benefits,                            the patient was deemed in satisfactory condition to                            undergo the procedure.                           After obtaining informed consent, the colonoscope  was passed under direct vision. Throughout the                            procedure, the patient's blood pressure, pulse, and                            oxygen saturations were monitored continuously. The                            Olympus Scope SN: [id number] was introduced through                            the anus and advanced to the the cecum, identified                            by  appendiceal orifice and ileocecal valve. The                            ileocecal valve, appendiceal orifice, and rectum                            were photographed. The quality of the bowel                            preparation was excellent. The colonoscopy was                            performed without difficulty. The patient tolerated                            the procedure well. Scope In: 9:36:40 AM Scope Out: 9:48:30 AM Scope Withdrawal Time: 0 hours 8 minutes 36 seconds  Total Procedure Duration: 0 hours 11 minutes 50 seconds  Findings:                 The perianal and digital rectal examinations were                            normal.                           The entire examined colon appeared normal on direct                            and retroflexion views.                           The terminal ileum appeared normal. Complications:            No immediate complications. Estimated blood loss:                            None. Estimated Blood Loss:     Estimated blood loss: none. Impression:               - The entire examined colon is normal on direct and  retroflexion views.                           - Normal terminal ileum.                           - No specimens collected. Recommendation:           - Repeat colonoscopy in 10 years for screening                            purposes.                           - Patient has a contact number available for                            emergencies. The signs and symptoms of potential                            delayed complications were discussed with the                            patient. Return to normal activities tomorrow.                            Written discharge instructions were provided to the                            patient.                           - Resume previous diet.                           - Continue present medications. Meryl Dare, MD 02/05/2023 9:52:35 AM This  report has been signed electronically.

## 2023-02-05 NOTE — Progress Notes (Signed)
Uneventful anesthetic. Report to pacu rn. Vss. Care resumed by rn. 

## 2023-02-05 NOTE — Progress Notes (Signed)
History & Physical  Primary Care Physician:  Pcp, No Primary Gastroenterologist: Claudette Head, MD  Impression / Plan:  Average risk see screening for colonoscopy.  CHIEF COMPLAINT:  CRC screening   HPI: Rebecca Sexton is a 47 y.o. female average risk CRC screening for colonoscopy.    Past Medical History:  Diagnosis Date   Anemia    on meds   Anxiety    Phreesia 04/19/2020   Blood type, Rh positive    BRCA negative 03/2016   MyRisk neg except ATM VUS   Family history of breast cancer 03/2016   MyRisk neg   Family history of ovarian cancer    PAT AUNT   Genetic testing of female 03/2016   ATM VUS   History of mammogram 06/21/2011; [id number]   BIRADS 2 BENIGN @ [facility];  NEG   History of Papanicolaou smear of cervix 03/06/2013; 03/22/2016   -/-; -/-   Increased risk of breast cancer 03/2016   IBIS=24.4% PER [facility]   Ovarian cyst 11/2013   RIGHT OV CYSTS   Sickle cell trait (HCC)    Twin pregnancy delivered 05/15/2007   FEMALE/FEMALE @ 37WKS    Past Surgical History:  Procedure Laterality Date   CESAREAN SECTION  05/15/2007   TWINS   FOOT SURGERY  2006   LEFT   WISDOM TOOTH EXTRACTION  2020    Prior to Admission medications   Medication Sig Start Date End Date Taking? Authorizing Provider  Ferrous Sulfate (IRON PO) Take 1 tablet by mouth daily at 6 (six) AM.    [provider]  propranolol (INDERAL) 10 MG tablet TAKE 1 TABLET BY MOUTH 1 HOUR BEFORE REPRESENTATION, CAN REPEAT IF NEEDED 6 HOURS LATER Patient taking differently: Take 10 mg by mouth daily as needed. TAKE 1 TABLET BY MOUTH 1 HOUR BEFORE REPRESENTATION, CAN REPEAT IF NEEDED 6 HOURS LATER 11/14/22   Copland, Ilona Sorrel, PA-C    Current Outpatient Medications  Medication Sig Dispense Refill   Ferrous Sulfate (IRON PO) Take 1 tablet by mouth daily at 6 (six) AM.     propranolol (INDERAL) 10 MG tablet TAKE 1 TABLET BY MOUTH 1 HOUR BEFORE REPRESENTATION, CAN REPEAT IF NEEDED 6 HOURS LATER (Patient  taking differently: Take 10 mg by mouth daily as needed. TAKE 1 TABLET BY MOUTH 1 HOUR BEFORE REPRESENTATION, CAN REPEAT IF NEEDED 6 HOURS LATER) 30 tablet 0   Current Facility-Administered Medications  Medication Dose Route Frequency Provider Last Rate Last Admin   0.9 %  sodium chloride infusion  500 mL Intravenous Once Meryl Dare, MD        Allergies as of 02/05/2023   (No Known Allergies)    Family History  Problem Relation Age of Onset   Colon polyps Mother 65   Hypertension Mother    Aneurysm Mother    Hyperlipidemia Mother    Atrial fibrillation Mother    Cancer Father 31       Lung/Cancer    Stroke Father    Heart disease Father        Heart attack at death    Hypertension Sister    Other Sister        TWIN PREG   Breast cancer Maternal Aunt 60   Diabetes Maternal Uncle    Breast cancer Paternal Aunt 37   Breast cancer Paternal Aunt 65   Ovarian cancer Paternal Aunt 46   Other Cousin        TWIN PREG  Colon cancer Neg Hx    Esophageal cancer Neg Hx    Rectal cancer Neg Hx    Stomach cancer Neg Hx     Social History   Socioeconomic History   Marital status: Married    Spouse name: Not on file   Number of children: 3   Years of education: 14   Highest education level: Not on file  Occupational History   Occupation: INSURANCE AGENT  Tobacco Use   Smoking status: Never   Smokeless tobacco: Never  Vaping Use   Vaping status: Never Used  Substance and Sexual Activity   Alcohol use: No   Drug use: No   Sexual activity: Yes    Birth control/protection: None, Surgical    Comment: Vasectomy  Other Topics Concern   Not on file  Social History Narrative   Not on file   Social Determinants of Health   Financial Resource Strain: Not on file  Food Insecurity: Not on file  Transportation Needs: Not on file  Physical Activity: Not on file  Stress: Not on file  Social Connections: Not on file  Intimate Partner Violence: Not on file    Review of  Systems:  All systems reviewed were negative except where noted in HPI.   Physical Exam:  General:  Alert, well-developed, in NAD Head:  Normocephalic and atraumatic. Eyes:  Sclera clear, no icterus.   Conjunctiva pink. Ears:  Normal auditory acuity. Mouth:  No deformity or lesions.  Neck:  Supple; no masses. Lungs:  Clear throughout to auscultation.   No wheezes, crackles, or rhonchi.  Heart:  Regular rate and rhythm; no murmurs. Abdomen:  Soft, nondistended, nontender. No masses, hepatomegaly. No palpable masses.  Normal bowel sounds.    Rectal:  Deferred   Msk:  Symmetrical without gross deformities. Extremities:  Without edema. Neurologic:  Alert and  oriented x 4; grossly normal neurologically. Skin:  Intact without significant lesions or rashes. Psych:  Alert and cooperative. Normal mood and affect.   Venita Lick. Russella Dar  02/05/2023, 9:28 AM See Loretha Stapler, [facility], to contact our on call provider

## 2023-02-05 NOTE — Progress Notes (Signed)
Pt's states no medical or surgical changes since previsit or office visit. 

## 2023-02-05 NOTE — Patient Instructions (Signed)
YOU HAD AN ENDOSCOPIC PROCEDURE TODAY AT [facility]:   Refer to the procedure report that was given to you for any specific questions about what was found during the examination.  If the procedure report does not answer your questions, please call your gastroenterologist to clarify.  If you requested that your care partner not be given the details of your procedure findings, then the procedure report has been included in a sealed envelope for you to review at your convenience later.  YOU SHOULD EXPECT: Some feelings of bloating in the abdomen. Passage of more gas than usual.  Walking can help get rid of the air that was put into your GI tract during the procedure and reduce the bloating. If you had a lower endoscopy (such as a colonoscopy or flexible sigmoidoscopy) you may notice spotting of blood in your stool or on the toilet paper. If you underwent a bowel prep for your procedure, you may not have a normal bowel movement for a few days.  Please Note:  You might notice some irritation and congestion in your nose or some drainage.  This is from the oxygen used during your procedure.  There is no need for concern and it should clear up in a day or so.  SYMPTOMS TO REPORT IMMEDIATELY:  Following lower endoscopy (colonoscopy or flexible sigmoidoscopy):  Excessive amounts of blood in the stool  Significant tenderness or worsening of abdominal pains  Swelling of the abdomen that is new, acute  Fever of 100F or higher   For urgent or emergent issues, a gastroenterologist can be reached at any hour by calling [phone]. Do not use MyChart messaging for urgent concerns.    DIET:  We do recommend a small meal at first, but then you may proceed to your regular diet.  Drink plenty of fluids but you should avoid alcoholic beverages for 24 hours.  MEDICATIONS: Continue present medications.  FOLLOW UP: Repeat colonoscopy in 10 years for screening purposes.  Thank you for allowing  [place] to provide for your healthcare needs today.  ACTIVITY:  You should plan to take it easy for the rest of today and you should NOT DRIVE or use heavy machinery until tomorrow (because of the sedation medicines used during the test).    FOLLOW UP: Our staff will call the number listed on your records the next business day following your procedure.  We will call around 7:15- 8:00 am to check on you and address any questions or concerns that you may have regarding the information given to you following your procedure. If we do not reach you, we will leave a message.     If any biopsies were taken you will be contacted by phone or by letter within the next 1-3 weeks.  Please call us at [phone] if you have not heard about the biopsies in 3 weeks.    SIGNATURES/CONFIDENTIALITY: You and/or your care partner have signed paperwork which will be entered into your electronic medical record.  These signatures attest to the fact that that the information above on your After Visit Summary has been reviewed and is understood.  Full responsibility of the confidentiality of this discharge information lies with you and/or your care-partner.

## 2023-02-06 ENCOUNTER — Telehealth: Payer: Self-pay

## 2023-02-06 NOTE — Telephone Encounter (Signed)
  Follow up Call-     02/05/2023    8:47 AM  Call back number  Post procedure Call Back phone  # [phone]  Permission to leave phone message Yes     Patient questions:  Do you have a fever, pain , or abdominal swelling? No. Pain Score  0 *  Have you tolerated food without any problems? Yes.    Have you been able to return to your normal activities? Yes.    Do you have any questions about your discharge instructions: Diet   No. Medications  No. Follow up visit  No.  Do you have questions or concerns about your Care? No.  Actions: * If pain score is 4 or above: No action needed, pain <4.

## 2023-04-29 DIAGNOSIS — A6000 Herpesviral infection of urogenital system, unspecified: Secondary | ICD-10-CM

## 2023-04-29 HISTORY — DX: Herpesviral infection of urogenital system, unspecified: A60.00

## 2023-05-02 ENCOUNTER — Ambulatory Visit
Admission: EM | Admit: 2023-05-02 | Discharge: 2023-05-02 | Disposition: A | Discharge status: Home / Self Care | Payer: Managed Care, Other (non HMO) | Attending: Family Medicine | Admitting: Family Medicine

## 2023-05-02 DIAGNOSIS — N898 Other specified noninflammatory disorders of vagina: Secondary | ICD-10-CM | POA: Insufficient documentation

## 2023-05-02 LAB — POCT URINALYSIS DIP (MANUAL ENTRY)
Bilirubin, UA: NEGATIVE
Glucose, UA: NEGATIVE mg/dL
Ketones, POC UA: NEGATIVE mg/dL
Leukocytes, UA: NEGATIVE
Nitrite, UA: NEGATIVE
Protein Ur, POC: NEGATIVE mg/dL
Spec Grav, UA: >=1.03 — AB (ref 1.010–1.025)
Urobilinogen, UA: 0.2 U/dL
pH, UA: 6 (ref 5.0–8.0)

## 2023-05-02 NOTE — ED Triage Notes (Signed)
Patient states she is having pain when she urinate x 1 day and have a sore on vagina. No treatment used.

## 2023-05-02 NOTE — Discharge Instructions (Addendum)
You have had testing sent today. We will call you with any significant abnormalities or if there is need to begin or change treatment or pursue further follow up.  You may also review your test results online through MyChart. If you do not have a MyChart account, instructions to sign up should be on your discharge paperwork.  Orders Placed This Encounter     HSV Culture And Typing   RPR   HIV Antibody (routine testing w rflx)   Vaginal cytology   If the area becomes painful, you may try over the counter Dermoplast spray as needed.

## 2023-05-02 NOTE — ED Provider Notes (Signed)
[facility]   [id number] 05/02/23 Arrival Time: 0806  ASSESSMENT & PLAN:  1. Vaginal lesion       Discharge Instructions      You have had testing sent today. We will call you with any significant abnormalities or if there is need to begin or change treatment or pursue further follow up.  You may also review your test results online through MyChart. If you do not have a MyChart account, instructions to sign up should be on your discharge paperwork.  Orders Placed This Encounter     HSV Culture And Typing   RPR   HIV Antibody (routine testing w rflx)   Vaginal cytology   If the area becomes painful, you may try over the counter Dermoplast spray as needed.      Without s/s of PID.  Labs Reviewed  POCT URINALYSIS DIP (MANUAL ENTRY) - Abnormal; Notable for the following components:      Result Value   Spec Grav, UA >=1.030 (*)    Blood, UA trace-intact (*)    All other components within normal limits  HSV CULTURE AND TYPING  RPR  HIV ANTIBODY (ROUTINE TESTING W REFLEX)  CERVICOVAGINAL ANCILLARY ONLY    Will notify of any positive results. She does have a GYN provider and will try to make f/u. May f/u here as needed.  Reviewed expectations re: course of current medical issues. Questions answered. Outlined signs and symptoms indicating need for more acute intervention. Patient verbalized understanding. After Visit Summary given.   SUBJECTIVE:  Rebecca Sexton is a 47 y.o. female who presents with complaint of vaginal "sore"; gradual onset; first noted approx 3-4 d ago. Lesion itself not very painful but when she urinates it is painful if urine hits it. Denies trauma/injury to this area. Denies bleeding/drainage. No h/o similar. No tx PTA. Married. Has no concerns over STI.  Patient's last menstrual period was 04/09/2023.   OBJECTIVE:  Vitals:   05/02/23 0818 05/02/23 0820  BP:  (!) 141/96  Pulse:  (!) 107  Resp:  16  Temp:  98.3 F (36.8 C)   TempSrc:  Oral  SpO2:  100%  Weight: 76.2 kg   Height: 5\' 5"  (1.651 m)     Slight tachycardia noted. General appearance: alert, cooperative, appears stated age and no distress GU: (nurse chaperone present during entire exam) normal appearing external genitalia except for an approx 0.5 x 1 cm superficial erosion of inferior inner labia majora; denies TTP; no areas of swelling/fluctuance; no signs of hair follicle infection; without bleeding Skin: warm and dry Psychological: alert and cooperative; normal mood and affect.  Results for orders placed or performed during the hospital encounter of 05/02/23  POCT urinalysis dipstick  Result Value Ref Range   Color, UA yellow yellow   Clarity, UA clear clear   Glucose, UA negative negative mg/dL   Bilirubin, UA negative negative   Ketones, POC UA negative negative mg/dL   Spec Grav, UA >=7.425 (A) 1.010 - 1.025   Blood, UA trace-intact (A) negative   pH, UA 6.0 5.0 - 8.0   Protein Ur, POC negative negative mg/dL   Urobilinogen, UA 0.2 0.2 or 1.0 E.U./dL   Nitrite, UA Negative Negative   Leukocytes, UA Negative Negative    Labs Reviewed  POCT URINALYSIS DIP (MANUAL ENTRY) - Abnormal; Notable for the following components:      Result Value   Spec Grav, UA >=1.030 (*)    Blood, UA trace-intact (*)  All other components within normal limits  HSV CULTURE AND TYPING  RPR  HIV ANTIBODY (ROUTINE TESTING W REFLEX)  CERVICOVAGINAL ANCILLARY ONLY    No Known Allergies  Past Medical History:  Diagnosis Date   Anemia    on meds   Anxiety    Phreesia 04/19/2020   Blood type, Rh positive    BRCA negative 03/2016   MyRisk neg except ATM VUS   Family history of breast cancer 03/2016   MyRisk neg   Family history of ovarian cancer    PAT AUNT   Genetic testing of female 03/2016   ATM VUS   History of mammogram 06/21/2011; [id number]   BIRADS 2 BENIGN @ [facility];  NEG   History of Papanicolaou smear of cervix 03/06/2013; 03/22/2016    -/-; -/-   Increased risk of breast cancer 03/2016   IBIS=24.4% PER MYRIAD   Ovarian cyst 11/2013   RIGHT OV CYSTS   Sickle cell trait (HCC)    Twin pregnancy delivered 05/15/2007   FEMALE/FEMALE @ 37WKS   Family History  Problem Relation Age of Onset   Colon polyps Mother 97   Hypertension Mother    Aneurysm Mother    Hyperlipidemia Mother    Atrial fibrillation Mother    Cancer Father 66       Lung/Cancer    Stroke Father    Heart disease Father        Heart attack at death    Hypertension Sister    Other Sister        TWIN PREG   Breast cancer Maternal Aunt 60   Diabetes Maternal Uncle    Breast cancer Paternal Aunt 53   Breast cancer Paternal Aunt 29   Ovarian cancer Paternal Aunt 54   Other Cousin        TWIN PREG   Colon cancer Neg Hx    Esophageal cancer Neg Hx    Rectal cancer Neg Hx    Stomach cancer Neg Hx    Social History   Socioeconomic History   Marital status: Married    Spouse name: Not on file   Number of children: 3   Years of education: 14   Highest education level: Not on file  Occupational History   Occupation: INSURANCE AGENT  Tobacco Use   Smoking status: Never   Smokeless tobacco: Never  Vaping Use   Vaping status: Never Used  Substance and Sexual Activity   Alcohol use: No   Drug use: No   Sexual activity: Yes    Birth control/protection: None, Surgical    Comment: Vasectomy  Other Topics Concern   Not on file  Social History Narrative   Not on file   Social Determinants of Health   Financial Resource Strain: Not on file  Food Insecurity: Not on file  Transportation Needs: Not on file  Physical Activity: Not on file  Stress: Not on file  Social Connections: Not on file  Intimate Partner Violence: Not on file           Grimes, MD 05/02/23 [phone]

## 2023-05-03 LAB — RPR: RPR Ser Ql: NONREACTIVE

## 2023-05-03 LAB — HIV ANTIBODY (ROUTINE TESTING W REFLEX): HIV Screen 4th Generation wRfx: NONREACTIVE

## 2023-05-04 ENCOUNTER — Telehealth (HOSPITAL_COMMUNITY): Payer: Self-pay

## 2023-05-04 LAB — CERVICOVAGINAL ANCILLARY ONLY
Candida Glabrata: NEGATIVE
Candida Vaginitis: POSITIVE — AB
Chlamydia: NEGATIVE
Comment: NEGATIVE
Comment: NEGATIVE
Comment: NEGATIVE
Comment: NEGATIVE
Comment: NORMAL
Neisseria Gonorrhea: NEGATIVE
Trichomonas: NEGATIVE

## 2023-05-04 LAB — HSV CULTURE AND TYPING

## 2023-05-04 MED ORDER — FLUCONAZOLE 150 MG PO TABS: 150.0000 mg | ORAL_TABLET | Freq: Once | ORAL | 0 refills | Status: AC

## 2023-05-04 NOTE — Telephone Encounter (Signed)
Per protocol, pt requires tx with Diflucan.  Rx sent to pharmacy on file.

## 2023-05-05 ENCOUNTER — Telehealth: Payer: Self-pay

## 2023-05-05 MED ORDER — VALACYCLOVIR HCL 1 G PO TABS: 1000.0000 mg | ORAL_TABLET | Freq: Two times a day (BID) | ORAL | 0 refills | Status: AC

## 2023-05-05 NOTE — Telephone Encounter (Signed)
Per protocol, pt requires tx with Valtrex.  Reviewed with patient, verified pharmacy, prescription sent.

## 2023-05-07 ENCOUNTER — Telehealth: Payer: Self-pay

## 2023-05-07 ENCOUNTER — Other Ambulatory Visit: Payer: Self-pay | Admitting: Obstetrics and Gynecology

## 2023-05-07 MED ORDER — SCOPOLAMINE 1 MG/3DAYS TD PT72: 1.0000 | MEDICATED_PATCH | TRANSDERMAL | 0 refills | Status: DC

## 2023-05-07 NOTE — Telephone Encounter (Signed)
Pt calling needs refill of motion sickness patch for a cruise she is going on later this week.  [phone]

## 2023-05-07 NOTE — Progress Notes (Signed)
Rx RF scopolamine for cruise

## 2023-05-07 NOTE — Telephone Encounter (Signed)
Rx RF eRxd. Enjoy trip! :-)

## 2023-05-08 NOTE — Telephone Encounter (Signed)
Pt aware.

## 2023-05-14 NOTE — Progress Notes (Unsigned)
Pcp, No   No chief complaint on file.   HPI:      [name] is a 47 y.o. 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 at [facility] 05/02/23, started on valtrex  Patient Active Problem List   Diagnosis Date Noted   Iron deficiency anemia due to chronic blood loss 11/14/2022   Intrahepatic bile duct dilation 03/08/2021   GAD (generalized anxiety disorder)/Social Anxiety  04/19/2020   Vitamin D deficiency 01/15/2018   Overweight (BMI 25.0-29.9) 01/15/2018   Fear of public speaking 01/15/2018   Increased risk of breast cancer 03/29/2016    Past Surgical History:  Procedure Laterality Date   CESAREAN SECTION  05/15/2007   TWINS   FOOT SURGERY  2006   LEFT   WISDOM TOOTH EXTRACTION  2020    Family History  Problem Relation Age of Onset   Colon polyps Mother 51   Hypertension Mother    Aneurysm Mother    Hyperlipidemia Mother    Atrial fibrillation Mother    Cancer Father 53       Lung/Cancer    Stroke Father    Heart disease Father        Heart attack at death    Hypertension Sister    Other Sister        TWIN PREG   Breast cancer Maternal Aunt 60   Diabetes Maternal Uncle    Breast cancer Paternal Aunt 41   Breast cancer Paternal Aunt 42   Ovarian cancer Paternal Aunt 72   Other Cousin        TWIN PREG   Colon cancer Neg Hx    Esophageal cancer Neg Hx    Rectal cancer Neg Hx    Stomach cancer Neg Hx     Social History   Socioeconomic History   Marital status: Married    Spouse name: Not on file   Number of children: 3   Years of education: 14   Highest education level: Not on file  Occupational History   Occupation: INSURANCE AGENT  Tobacco Use   Smoking status: Never   Smokeless tobacco: Never  Vaping Use   Vaping status: Never Used  Substance and Sexual Activity   Alcohol use: No   Drug use: No   Sexual activity: Yes    Birth  control/protection: None, Surgical    Comment: Vasectomy  Other Topics Concern   Not on file  Social History Narrative   Not on file   Social Drivers of Health   Financial Resource Strain: Not on file  Food Insecurity: Not on file  Transportation Needs: Not on file  Physical Activity: Not on file  Stress: Not on file  Social Connections: Not on file  Intimate Partner Violence: Not on file    Outpatient Medications Prior to Visit  Medication Sig Dispense Refill   Ferrous Sulfate (IRON PO) Take 1 tablet by mouth daily at 6 (six) AM.     propranolol (INDERAL) 10 MG tablet TAKE 1 TABLET BY MOUTH 1 HOUR BEFORE REPRESENTATION, CAN REPEAT IF NEEDED 6 HOURS LATER (Patient taking differently: Take 10 mg by mouth daily as needed. TAKE 1 TABLET BY MOUTH 1 HOUR BEFORE REPRESENTATION, CAN REPEAT IF NEEDED 6 HOURS LATER) 30 tablet 0   scopolamine (TRANSDERM-SCOP) 1 MG/3DAYS Place 1 patch (1.5 mg total) onto the skin every 3 (three) days. 3 patch 0  Facility-Administered Medications Prior to Visit  Medication Dose Route Frequency Provider Last Rate Last Admin   0.9 %  sodium chloride infusion  500 mL Intravenous Once Meryl Dare, MD          ROS:  Review of Systems BREAST: No symptoms   OBJECTIVE:   Vitals:  LMP 04/09/2023   Physical Exam  Results: No results found for this or any previous visit (from the past 24 hours).   Assessment/Plan: No diagnosis found.    No orders of the defined types were placed in this encounter.     No follow-ups on file.  Lorence Nagengast B. Ely Ballen, PA-C 05/14/2023 2:03 PM

## 2023-05-17 ENCOUNTER — Encounter: Payer: Self-pay | Admitting: Obstetrics and Gynecology

## 2023-05-17 ENCOUNTER — Ambulatory Visit: Payer: Managed Care, Other (non HMO) | Admitting: Obstetrics and Gynecology

## 2023-05-17 VITALS — BP 131/75 | HR 89 | Ht 65.0 in | Wt 165.0 lb

## 2023-05-17 DIAGNOSIS — A6004 Herpesviral vulvovaginitis: Secondary | ICD-10-CM | POA: Diagnosis not present

## 2023-05-17 MED ORDER — VALACYCLOVIR HCL 500 MG PO TABS: 500.0000 mg | ORAL_TABLET | Freq: Every day | ORAL | 0 refills | Status: DC

## 2023-05-17 NOTE — Patient Instructions (Signed)
I value your feedback and you entrusting us with your care. If you get a [facility] patient survey, I would appreciate you taking the time to let us know about your experience today. Thank you! ? ? ?

## 2023-05-21 ENCOUNTER — Encounter: Payer: Self-pay | Admitting: Obstetrics and Gynecology

## 2023-09-06 ENCOUNTER — Other Ambulatory Visit: Payer: Self-pay | Admitting: Obstetrics and Gynecology

## 2023-09-06 DIAGNOSIS — F419 Anxiety disorder, unspecified: Secondary | ICD-10-CM

## 2023-09-06 DIAGNOSIS — F40248 Other situational type phobia: Secondary | ICD-10-CM

## 2023-09-06 DIAGNOSIS — A6004 Herpesviral vulvovaginitis: Secondary | ICD-10-CM

## 2023-10-08 ENCOUNTER — Encounter: Payer: Self-pay | Admitting: Obstetrics and Gynecology

## 2023-10-09 ENCOUNTER — Other Ambulatory Visit: Payer: Self-pay | Admitting: Obstetrics and Gynecology

## 2023-10-09 DIAGNOSIS — F419 Anxiety disorder, unspecified: Secondary | ICD-10-CM

## 2023-10-09 DIAGNOSIS — F40248 Other situational type phobia: Secondary | ICD-10-CM

## 2023-10-09 DIAGNOSIS — A6004 Herpesviral vulvovaginitis: Secondary | ICD-10-CM

## 2023-10-09 MED ORDER — VALACYCLOVIR HCL 500 MG PO TABS: 500.0000 mg | ORAL_TABLET | Freq: Every day | ORAL | 0 refills | Status: DC

## 2023-10-09 MED ORDER — PROPRANOLOL HCL 10 MG PO TABS: ORAL_TABLET | ORAL | 0 refills | Status: DC

## 2023-10-09 NOTE — Progress Notes (Signed)
 Rx RF valtrex  and propranolol  eRxd; annual due 6/25

## 2023-10-19 ENCOUNTER — Ambulatory Visit
Admission: RE | Admit: 2023-10-19 | Discharge: 2023-10-19 | Disposition: A | Discharge status: Home / Self Care | Source: Ambulatory Visit | Attending: Obstetrics and Gynecology | Admitting: Obstetrics and Gynecology

## 2023-10-19 DIAGNOSIS — Z1231 Encounter for screening mammogram for malignant neoplasm of breast: Secondary | ICD-10-CM | POA: Diagnosis present

## 2023-10-26 ENCOUNTER — Ambulatory Visit: Payer: Self-pay | Admitting: Obstetrics and Gynecology

## 2023-11-15 ENCOUNTER — Ambulatory Visit: Admitting: Obstetrics and Gynecology

## 2023-11-19 ENCOUNTER — Encounter: Payer: Self-pay | Admitting: Obstetrics and Gynecology

## 2023-11-19 NOTE — Progress Notes (Unsigned)
 PCP:  Pcp, No   No chief complaint on file.    HPI:      [name] is a 48 y.o. G2P2003 who LMP was No LMP recorded. (Menstrual status: Irregular Periods)., presents today for her annual examination.  Her menses are irregular now, every couple of months, lasting 3-4 days, mod flow vs spotting a few days, no BTB, mild dysmen. Has started having tolerable vasomotor sx, acne, brain fog. Hx of leio on 9/22 GYN u/s with hx of IDA.  Sex activity: single partner, contraception -vasectomy. No pain/bleeding. Has had some dryness, no lubricants use.  Last Pap: 09/28/20  Results were: no abnormalities /neg HPV DNA  Hx of STDs: HSV 2 on culture 12/24  Last mammogram: 10/19/23  Results: no abnormalities, repeat in 12 months There is a FH of breast cancer in her mat aunt and 2 pat aunts. There is a FH of ovarian cancer in a 3rd pat aunt. Pt is MyRisk neg except ATM VUS. The patient does do self-breast exams. IBIS=24%. Not taking Vit D supp now. Has not had scr breast MRI as discussed previously.  Tobacco use: The patient denies current or previous tobacco use. Alcohol use: none No drug use.  Exercise: mod active  Colonoscopy: never; would like colonoscopy in [place]  She does get adequate calcium but not Vitamin D  in her diet.  Labs with PCP in past, but they have left. Hx of IDA 11/21-6/23. No longer taking Fe supp daily.   Was taking buspar  7.5 mg QD to BID for anxiety that caused dizziness, so not taking it. Takes propranolol  prn speaking engagements, etc. Needs Rx RF since PCP left practice. Anxiety sx tolerable otherwise.  Past Medical History:  Diagnosis Date   Anemia    on meds   Anxiety    Phreesia 04/19/2020   Blood type, Rh positive    BRCA negative 03/2016   MyRisk neg except ATM VUS   Family history of breast cancer 03/2016   MyRisk neg   Family history of ovarian cancer    PAT AUNT   Genetic testing of female 03/2016   ATM VUS   Genital herpes 04/2023   History of  mammogram 06/21/2011; [id number]   BIRADS 2 BENIGN @ [facility];  NEG   History of Papanicolaou smear of cervix 03/06/2013; 03/22/2016   -/-; -/-   Increased risk of breast cancer 03/2016   IBIS=24.4% PER MYRIAD   Ovarian cyst 11/2013   RIGHT OV CYSTS   Sickle cell trait (HCC)    Twin pregnancy delivered 05/15/2007   FEMALE/FEMALE @ 37WKS    Past Surgical History:  Procedure Laterality Date   CESAREAN SECTION  05/15/2007   TWINS   FOOT SURGERY  2006   LEFT   WISDOM TOOTH EXTRACTION  2020    Family History  Problem Relation Age of Onset   Colon polyps Mother 40   Hypertension Mother    Aneurysm Mother    Hyperlipidemia Mother    Atrial fibrillation Mother    Cancer Father 55       Lung/Cancer    Stroke Father    Heart disease Father        Heart attack at death    Hypertension Sister    Other Sister        TWIN PREG   Breast cancer Maternal Aunt 60   Diabetes Maternal Uncle    Breast cancer Paternal Aunt 65   Breast cancer Paternal Aunt 78  Ovarian cancer Paternal Aunt 42   Other Cousin        TWIN PREG   Colon cancer Neg Hx    Esophageal cancer Neg Hx    Rectal cancer Neg Hx    Stomach cancer Neg Hx     Social History   Socioeconomic History   Marital status: Married    Spouse name: Not on file   Number of children: 3   Years of education: 14   Highest education level: Not on file  Occupational History   Occupation: INSURANCE AGENT  Tobacco Use   Smoking status: Never   Smokeless tobacco: Never  Vaping Use   Vaping status: Never Used  Substance and Sexual Activity   Alcohol use: No   Drug use: No   Sexual activity: Yes    Birth control/protection: None, Surgical    Comment: Vasectomy  Other Topics Concern   Not on file  Social History Narrative   Not on file   Social Drivers of Health   Financial Resource Strain: Not on file  Food Insecurity: Not on file  Transportation Needs: Not on file  Physical Activity: Not on file  Stress: Not on file   Social Connections: Not on file  Intimate Partner Violence: Not on file    No outpatient medications have been marked as taking for the 11/20/23 encounter (Appointment) with Evrett Hakim B, PA-C.   Current Facility-Administered Medications for the 11/20/23 encounter (Appointment) with Jamauri Kruzel B, PA-C  Medication   0.9 %  sodium chloride  infusion     ROS:  Review of Systems  Constitutional:  Negative for fatigue, fever and unexpected weight change.  Respiratory:  Positive for cough. Negative for shortness of breath and wheezing.   Cardiovascular:  Negative for chest pain, palpitations and leg swelling.  Gastrointestinal:  Negative for blood in stool, constipation, diarrhea, nausea and vomiting.  Endocrine: Negative for cold intolerance, heat intolerance and polyuria.  Genitourinary:  Negative for dyspareunia, dysuria, flank pain, frequency, genital sores, hematuria, menstrual problem, pelvic pain, urgency, vaginal bleeding, vaginal discharge and vaginal pain.  Musculoskeletal:  Negative for back pain, joint swelling and myalgias.  Skin:  Negative for rash.  Neurological:  Negative for dizziness, syncope, light-headedness, numbness and headaches.  Hematological:  Negative for adenopathy.  Psychiatric/Behavioral:  Positive for agitation. Negative for confusion, sleep disturbance and suicidal ideas. The patient is not nervous/anxious.      Objective: There were no vitals taken for this visit.   Physical Exam Constitutional:      Appearance: She is well-developed.  Genitourinary:     Vulva normal.     Right Labia: No rash, tenderness or lesions.    Left Labia: No tenderness, lesions or rash.    No vaginal discharge, erythema or tenderness.      Right Adnexa: not tender and no mass present.    Left Adnexa: not tender and no mass present.    No cervical motion tenderness, friability or polyp.     Uterus is enlarged.     Uterus is not tender.  Breasts:    Right: No  mass, nipple discharge, skin change or tenderness.     Left: No mass, nipple discharge, skin change or tenderness.  Neck:     Thyroid: No thyromegaly.   Cardiovascular:     Rate and Rhythm: Normal rate and regular rhythm.     Heart sounds: Normal heart sounds. No murmur heard. Pulmonary:     Effort: Pulmonary effort is normal.  Breath sounds: Normal breath sounds.  Abdominal:     Palpations: Abdomen is soft.     Tenderness: There is no abdominal tenderness. There is no guarding or rebound.   Musculoskeletal:        General: Normal range of motion.     Cervical back: Normal range of motion.  Lymphadenopathy:     Cervical: No cervical adenopathy.   Neurological:     General: No focal deficit present.     Mental Status: She is alert and oriented to person, place, and time.     Cranial Nerves: No cranial nerve deficit.   Skin:    General: Skin is warm and dry.   Psychiatric:        Mood and Affect: Mood normal.        Behavior: Behavior normal.        Thought Content: Thought content normal.        Judgment: Judgment normal.  Vitals reviewed.    Assessment/Plan: Encounter for annual routine gynecological examination  Encounter for screening mammogram for malignant neoplasm of breast - Plan: MM 3D SCREENING MAMMOGRAM BILATERAL BREAST; pt to schedule mammo  Family history of breast cancer--MyRisk neg.   Increased risk of breast cancer--aware of recommendations of monthly SBE, yearly CBE and mammos, as well as scr breast MRI. Will call for MRI ref prn. Resume Vit D supp.   Screening for colon cancer - Plan: Ambulatory referral to Gastroenterology; refer to GI in [place]  Perimenopause--f/u prn AUB. Add B complex for brain fog  Fear of public speaking - Plan: propranolol  (INDERAL ) 10 MG tablet; Rx RF  Blood tests for routine general physical examination - Plan: Comprehensive metabolic panel, Lipid panel, CBC with Differential/Platelet  Iron deficiency anemia due to  chronic blood loss - Plan: CBC with Differential/Platelet  Screening cholesterol level - Plan: Lipid panel    No orders of the defined types were placed in this encounter.         GYN counsel breast self exam, mammography screening, adequate intake of calcium and vitamin D , diet and exercise     F/U  No follow-ups on file.  Jaydyn Menon B. [place] No[place], PA-C 11/19/2023 10:09 AM

## 2023-11-20 ENCOUNTER — Ambulatory Visit (INDEPENDENT_AMBULATORY_CARE_PROVIDER_SITE_OTHER): Admitting: Obstetrics and Gynecology

## 2023-11-20 ENCOUNTER — Encounter: Payer: Self-pay | Admitting: Obstetrics and Gynecology

## 2023-11-20 ENCOUNTER — Other Ambulatory Visit (HOSPITAL_COMMUNITY)
Admission: RE | Admit: 2023-11-20 | Discharge: 2023-11-20 | Disposition: A | Discharge status: Home / Self Care | Source: Ambulatory Visit | Attending: Obstetrics and Gynecology | Admitting: Obstetrics and Gynecology

## 2023-11-20 VITALS — BP 129/87 | HR 72 | Ht 65.0 in | Wt 172.0 lb

## 2023-11-20 DIAGNOSIS — Z1211 Encounter for screening for malignant neoplasm of colon: Secondary | ICD-10-CM

## 2023-11-20 DIAGNOSIS — Z1151 Encounter for screening for human papillomavirus (HPV): Secondary | ICD-10-CM

## 2023-11-20 DIAGNOSIS — Z01419 Encounter for gynecological examination (general) (routine) without abnormal findings: Secondary | ICD-10-CM | POA: Diagnosis not present

## 2023-11-20 DIAGNOSIS — Z9189 Other specified personal risk factors, not elsewhere classified: Secondary | ICD-10-CM

## 2023-11-20 DIAGNOSIS — N951 Menopausal and female climacteric states: Secondary | ICD-10-CM | POA: Diagnosis not present

## 2023-11-20 DIAGNOSIS — F40248 Other situational type phobia: Secondary | ICD-10-CM

## 2023-11-20 DIAGNOSIS — Z124 Encounter for screening for malignant neoplasm of cervix: Secondary | ICD-10-CM | POA: Insufficient documentation

## 2023-11-20 DIAGNOSIS — Z803 Family history of malignant neoplasm of breast: Secondary | ICD-10-CM

## 2023-11-20 DIAGNOSIS — Z1272 Encounter for screening for malignant neoplasm of vagina: Secondary | ICD-10-CM | POA: Diagnosis not present

## 2023-11-20 DIAGNOSIS — Z1231 Encounter for screening mammogram for malignant neoplasm of breast: Secondary | ICD-10-CM

## 2023-11-20 DIAGNOSIS — A6004 Herpesviral vulvovaginitis: Secondary | ICD-10-CM

## 2023-11-20 DIAGNOSIS — D500 Iron deficiency anemia secondary to blood loss (chronic): Secondary | ICD-10-CM

## 2023-11-20 MED ORDER — PROPRANOLOL HCL 10 MG PO TABS: ORAL_TABLET | ORAL | 0 refills | Status: DC

## 2023-11-20 MED ORDER — VALACYCLOVIR HCL 500 MG PO TABS: 500.0000 mg | ORAL_TABLET | Freq: Every day | ORAL | 3 refills | Status: AC

## 2023-11-20 MED ORDER — PROGESTERONE MICRONIZED 100 MG PO CAPS: ORAL_CAPSULE | ORAL | 0 refills | Status: DC

## 2023-11-20 NOTE — Patient Instructions (Signed)
 I value your feedback and you entrusting [place] with your care. If you get a [facility] patient survey, I would appreciate you taking the time to let us know about your experience today. Thank you! ? ? ?

## 2023-11-21 ENCOUNTER — Ambulatory Visit: Payer: Self-pay | Admitting: Obstetrics and Gynecology

## 2023-11-21 LAB — CBC WITH DIFFERENTIAL/PLATELET
Basophils Absolute: 0 10*3/uL (ref 0.0–0.2)
Basos: 1 %
EOS (ABSOLUTE): 0.1 10*3/uL (ref 0.0–0.4)
Eos: 3 %
Hematocrit: 40.2 % (ref 34.0–46.6)
Hemoglobin: 13.1 g/dL (ref 11.1–15.9)
Immature Grans (Abs): 0 10*3/uL (ref 0.0–0.1)
Immature Granulocytes: 0 %
Lymphocytes Absolute: 1.3 10*3/uL (ref 0.7–3.1)
Lymphs: 40 %
MCH: 29.1 pg (ref 26.6–33.0)
MCHC: 32.6 g/dL (ref 31.5–35.7)
MCV: 89 fL (ref 79–97)
Monocytes Absolute: 0.2 10*3/uL (ref 0.1–0.9)
Monocytes: 7 %
Neutrophils Absolute: 1.6 10*3/uL (ref 1.4–7.0)
Neutrophils: 49 %
Platelets: 275 10*3/uL (ref 150–450)
RBC: 4.5 x10E6/uL (ref 3.77–5.28)
RDW: 14.3 % (ref 11.7–15.4)
WBC: 3.3 10*3/uL — ABNORMAL LOW (ref 3.4–10.8)

## 2023-11-21 LAB — IRON,TIBC AND FERRITIN PANEL
Ferritin: 39 ng/mL (ref 15–150)
Iron Saturation: 25 % (ref 15–55)
Iron: 72 ug/dL (ref 27–159)
Total Iron Binding Capacity: 290 ug/dL (ref 250–450)
UIBC: 218 ug/dL (ref 131–425)

## 2023-11-21 LAB — CYTOLOGY - PAP
Adequacy: ABSENT
Comment: NEGATIVE
Diagnosis: NEGATIVE
High risk HPV: NEGATIVE

## 2024-04-08 ENCOUNTER — Encounter: Payer: Self-pay | Admitting: Obstetrics and Gynecology

## 2024-04-08 ENCOUNTER — Other Ambulatory Visit: Payer: Self-pay | Admitting: Obstetrics and Gynecology

## 2024-04-08 MED ORDER — SCOPOLAMINE 1 MG/3DAYS TD PT72: 1.0000 | MEDICATED_PATCH | TRANSDERMAL | 0 refills | Status: AC

## 2024-04-08 NOTE — Progress Notes (Signed)
 Rx RF patch for cruise

## 2024-04-11 ENCOUNTER — Other Ambulatory Visit: Payer: Self-pay | Admitting: Obstetrics and Gynecology

## 2024-04-11 DIAGNOSIS — N951 Menopausal and female climacteric states: Secondary | ICD-10-CM

## 2024-04-11 DIAGNOSIS — F4010 Social phobia, unspecified: Secondary | ICD-10-CM

## 2024-04-11 MED ORDER — PROPRANOLOL HCL 10 MG PO TABS: ORAL_TABLET | ORAL | 0 refills | Status: AC

## 2024-04-11 MED ORDER — PROGESTERONE MICRONIZED 100 MG PO CAPS
ORAL_CAPSULE | ORAL | 2 refills | Status: AC
Start: 2024-04-11 — End: ?

## 2024-04-11 NOTE — Progress Notes (Signed)
 Rx RF prometrium . Doing well with VS sx.  Rx RF propranolol  to take prn.
